# Patient Record
Sex: Male | Born: 1975 | Race: White | Hispanic: No | Smoking: Never smoker
Health system: Southern US, Community
[De-identification: ages and names within clinical notes are randomized; demographics above are authoritative.]

## PROBLEM LIST (undated history)

## (undated) DIAGNOSIS — G43909 Migraine, unspecified, not intractable, without status migrainosus: Secondary | ICD-10-CM

## (undated) DIAGNOSIS — F1911 Other psychoactive substance abuse, in remission: Secondary | ICD-10-CM

## (undated) DIAGNOSIS — M5030 Other cervical disc degeneration, unspecified cervical region: Secondary | ICD-10-CM

## (undated) DIAGNOSIS — F419 Anxiety disorder, unspecified: Secondary | ICD-10-CM

## (undated) DIAGNOSIS — G4730 Sleep apnea, unspecified: Secondary | ICD-10-CM

## (undated) HISTORY — PX: ADENOIDECTOMY: SUR15

## (undated) HISTORY — DX: Anxiety disorder, unspecified: F41.9

## (undated) HISTORY — PX: KNEE SURGERY: SHX244

## (undated) HISTORY — PX: TONSILLECTOMY: SUR1361

## (undated) HISTORY — PX: NECK SURGERY: SHX720

## (undated) HISTORY — DX: Other psychoactive substance abuse, in remission: F19.11

## (undated) HISTORY — DX: Migraine, unspecified, not intractable, without status migrainosus: G43.909

## (undated) HISTORY — DX: Sleep apnea, unspecified: G47.30

## (undated) HISTORY — PX: CYSTOSCOPY W/ STONE MANIPULATION: SHX1427

## (undated) HISTORY — PX: NASAL SEPTUM SURGERY: SHX37

## (undated) HISTORY — PX: URETHRA SURGERY: SHX824

## (undated) HISTORY — DX: Other cervical disc degeneration, unspecified cervical region: M50.30

---

## 2001-07-20 ENCOUNTER — Emergency Department (HOSPITAL_COMMUNITY): Admission: EM | Admit: 2001-07-20 | Discharge: 2001-07-20 | Payer: Self-pay | Admitting: Emergency Medicine

## 2009-01-11 ENCOUNTER — Emergency Department (HOSPITAL_COMMUNITY): Admission: EM | Admit: 2009-01-11 | Discharge: 2009-01-11 | Payer: Self-pay | Admitting: Family Medicine

## 2009-03-01 ENCOUNTER — Emergency Department (HOSPITAL_BASED_OUTPATIENT_CLINIC_OR_DEPARTMENT_OTHER): Admission: EM | Admit: 2009-03-01 | Discharge: 2009-03-01 | Payer: Self-pay | Admitting: Emergency Medicine

## 2009-03-15 ENCOUNTER — Ambulatory Visit: Payer: Self-pay | Admitting: Diagnostic Radiology

## 2009-03-15 ENCOUNTER — Ambulatory Visit (HOSPITAL_BASED_OUTPATIENT_CLINIC_OR_DEPARTMENT_OTHER): Admission: RE | Admit: 2009-03-15 | Discharge: 2009-03-15 | Payer: Self-pay | Admitting: Orthopedic Surgery

## 2009-05-24 ENCOUNTER — Emergency Department (HOSPITAL_BASED_OUTPATIENT_CLINIC_OR_DEPARTMENT_OTHER): Admission: EM | Admit: 2009-05-24 | Discharge: 2009-05-24 | Payer: Self-pay | Admitting: Emergency Medicine

## 2009-05-31 ENCOUNTER — Emergency Department (HOSPITAL_COMMUNITY): Admission: EM | Admit: 2009-05-31 | Discharge: 2009-05-31 | Payer: Self-pay | Admitting: Family Medicine

## 2009-09-28 ENCOUNTER — Ambulatory Visit: Payer: Self-pay | Admitting: Diagnostic Radiology

## 2009-09-28 ENCOUNTER — Emergency Department (HOSPITAL_BASED_OUTPATIENT_CLINIC_OR_DEPARTMENT_OTHER): Admission: EM | Admit: 2009-09-28 | Discharge: 2009-09-28 | Payer: Self-pay | Admitting: Emergency Medicine

## 2010-04-11 ENCOUNTER — Emergency Department (HOSPITAL_BASED_OUTPATIENT_CLINIC_OR_DEPARTMENT_OTHER): Admission: EM | Admit: 2010-04-11 | Discharge: 2010-04-11 | Payer: Self-pay | Admitting: Emergency Medicine

## 2010-09-12 ENCOUNTER — Encounter: Admission: RE | Admit: 2010-09-12 | Discharge: 2010-09-12 | Payer: Self-pay | Admitting: Neurosurgery

## 2010-11-10 ENCOUNTER — Encounter: Payer: Self-pay | Admitting: Neurosurgery

## 2010-12-04 ENCOUNTER — Ambulatory Visit (HOSPITAL_BASED_OUTPATIENT_CLINIC_OR_DEPARTMENT_OTHER): Payer: 59 | Admitting: Physical Medicine & Rehabilitation

## 2010-12-04 ENCOUNTER — Encounter: Payer: 59 | Attending: Physical Medicine & Rehabilitation

## 2010-12-04 DIAGNOSIS — M5380 Other specified dorsopathies, site unspecified: Secondary | ICD-10-CM | POA: Insufficient documentation

## 2010-12-04 DIAGNOSIS — M25519 Pain in unspecified shoulder: Secondary | ICD-10-CM | POA: Insufficient documentation

## 2010-12-04 DIAGNOSIS — M5020 Other cervical disc displacement, unspecified cervical region: Secondary | ICD-10-CM | POA: Insufficient documentation

## 2010-12-04 DIAGNOSIS — M25529 Pain in unspecified elbow: Secondary | ICD-10-CM

## 2010-12-04 DIAGNOSIS — R209 Unspecified disturbances of skin sensation: Secondary | ICD-10-CM

## 2010-12-04 DIAGNOSIS — M542 Cervicalgia: Secondary | ICD-10-CM | POA: Insufficient documentation

## 2011-01-04 ENCOUNTER — Ambulatory Visit: Payer: 59 | Admitting: Physical Medicine & Rehabilitation

## 2011-01-17 NOTE — Consult Note (Signed)
Consult requested by Dr. Jeral Fruit in regard to neck and right shoulder pain.  Seth Booker is a 35 year old male who had onset of right shoulder pain.  He was initially evaluated by Orthopedics, Dr. Madelon Lips, who felt the patient had more of a cervical spine problem and referred the patient to Dr. Hilda Lias.  The patient also underwent MRI of the cervical spine demonstrating C3-4 broad-based disk osteophyte complex and is a C7- T1 left cord disk osteophyte complex, really nothing on the right side. He had EMG and CV right upper extremity which was negative for radiculopathy, ulnar or median neuropathy.  He was trialed on Norco, Robaxin, steroid taper.  He had a CT myelogram of the upper extremity of the neck on September 12, 2010, showed a small left posterolateral protrusion, a small central bulge at C5-6, noncompressive.  He has nighttime pain.  All previous x-rays of shoulder were reported as negative.  His average pain is around 8/10, currently 6.  Pain interferes with activity at 6/10 level.  Pain is worse in the evening hours.  Sleep is poor because of this.  Pain improves with rest medication.  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  He has some numbness in the little finger on the right side only.  SOCIAL HISTORY:  Married.  He had a DUI in 2005.  He uses smokeless tobacco.  He lives with his wife and daughter.  FAMILY HISTORY:  Diabetes in sister.  PHYSICAL EXAMINATION:  Blood pressure 149/97, pulse 112, respirations 18, sat 98% on room air.  He has positive impingement sign in right shoulder.  He has no evidence of scapular muscle atrophy.  He has no evidence of pain over the [facility] joint.  He has mild pain over the bicipital groove.  No snapping with external rotation at the bicipital groove.  He has no evidence of abnormality in his right biceps muscle.  He has negative Tinel at the right elbow.  Sensation is normal in the bilateral upper  extremities.  He has normal strength in the upper extremities. Neck has negative Spurling maneuver.  Neck has full range of motion.  IMPRESSION: 1. Right shoulder pain.  I really do not think this has anything to do     with his neck.  I suspect that he may have some cuff tendonitis and     he does have positive impingement sign.  He may have a biceps     tenosynovitis synovitis.  We will check with an ultrasound and see     if we can further delineate this. 2. Right fifth digit numbness.  This appears to be more intermittent     which may explain why his EMG is negative.  He may have a subluxing     ulnar nerve root, do dynamic imaging under ultrasound and see if     this is indeed the case.  Otherwise, he may need to keep his elbows     in an extended position and keep the weight off the elbow.  He gets relief with Neurontin 300 t.i.d.  He can continue this. Flexeril, he does get some relief with this.  Norco, he takes about three times a day.  We talked that our goal will be to wean off that at some point.  He may need to go through some physical therapy, however, to deal with his shoulder pain. Discussed with the patient, agrees with plan.     Erick Colace,  M.D.    AEK/MedQ D:12/04/2010 17:00:16  T:12/04/2010 22:53:56  Job #:  [id number]  cc:   Hilda Lias, M.D. Fax: [phone]  Electronically Signed by Claudette Laws M.D. on 01/17/2011 04:37:49 PM

## 2011-02-11 ENCOUNTER — Ambulatory Visit: Payer: 59 | Admitting: Physical Medicine & Rehabilitation

## 2011-05-23 ENCOUNTER — Emergency Department (INDEPENDENT_AMBULATORY_CARE_PROVIDER_SITE_OTHER): Payer: 59

## 2011-05-23 ENCOUNTER — Emergency Department (HOSPITAL_BASED_OUTPATIENT_CLINIC_OR_DEPARTMENT_OTHER)
Admission: EM | Admit: 2011-05-23 | Discharge: 2011-05-23 | Disposition: A | Discharge status: Home / Self Care | Payer: 59 | Attending: Emergency Medicine | Admitting: Emergency Medicine

## 2011-05-23 ENCOUNTER — Encounter: Payer: Self-pay | Admitting: *Deleted

## 2011-05-23 DIAGNOSIS — M7540 Impingement syndrome of unspecified shoulder: Secondary | ICD-10-CM

## 2011-05-23 DIAGNOSIS — M25519 Pain in unspecified shoulder: Secondary | ICD-10-CM | POA: Insufficient documentation

## 2011-05-23 DIAGNOSIS — R209 Unspecified disturbances of skin sensation: Secondary | ICD-10-CM

## 2011-05-23 MED ORDER — CYCLOBENZAPRINE HCL 10 MG PO TABS: 10.0000 mg | ORAL_TABLET | Freq: Two times a day (BID) | ORAL | Status: AC | PRN

## 2011-05-23 MED ORDER — OXYCODONE-ACETAMINOPHEN 5-325 MG PO TABS
ORAL_TABLET | ORAL | Status: AC
  Administered 2011-05-23: 1 via ORAL
  Filled 2011-05-23: qty 1

## 2011-05-23 MED ORDER — HYDROCODONE-ACETAMINOPHEN 5-325 MG PO TABS: 2.0000 | ORAL_TABLET | ORAL | Status: AC | PRN

## 2011-05-23 MED ORDER — CYCLOBENZAPRINE HCL 10 MG PO TABS: 10.0000 mg | ORAL_TABLET | Freq: Two times a day (BID) | ORAL | Status: DC | PRN

## 2011-05-23 MED ORDER — OXYCODONE-ACETAMINOPHEN 5-325 MG PO TABS
1.0000 | ORAL_TABLET | Freq: Once | ORAL | Status: AC
  Administered 2011-05-23: 1 via ORAL

## 2011-05-23 NOTE — ED Provider Notes (Signed)
History     CSN: [id number] Arrival date & time: 05/23/2011  8:41 AM  Chief Complaint  Patient presents with  . Shoulder Pain   HPI Comments: Pt reports left shoulder pain since Sunday. Denies injury. OTC pain medications ineffective. Patient states she's had problems with his right shoulder in the past. He saw Dr. Dorena Bodo and end up getting and referred for epidural injections. Patient states his symptoms resolved after that however this episode feels somewhat similar although when his left shoulder.  Patient is a 35 y.o. male presenting with shoulder pain. The history is provided by the patient.  Shoulder Pain The problem occurs constantly. The problem has not changed since onset.Pertinent negatives include no chest pain, no abdominal pain, no headaches and no shortness of breath. Exacerbated by: The pain increases with certain movements of his arm. Especially by him reaching back and over his head. Relieved by: Patient's tried antacids without relief. The treatment provided no relief.    History reviewed. No pertinent past medical history.  Past Surgical History  Procedure Date  . Tonsillectomy   . Adenoidectomy   . Knee surgery     No family history on file.  History  Substance Use Topics  . Smoking status: Never Smoker   . Smokeless tobacco: Not on file  . Alcohol Use: No      Review of Systems  Constitutional: Negative for fever.  Respiratory: Negative for chest tightness and shortness of breath.   Cardiovascular: Negative for chest pain.  Gastrointestinal: Negative for abdominal pain.  Neurological: Negative for headaches.  All other systems reviewed and are negative.    Physical Exam  BP 149/96  Pulse 91  Temp(Src) 98.2 F (36.8 C) (Oral)  Resp 16  Ht 6\' 2"  (1.88 m)  Wt 250 lb (113.399 kg)  BMI 32.10 kg/m2  SpO2 100%  Physical Exam  Constitutional: He appears well-developed and well-nourished. No distress.  HENT:  Head: Normocephalic and atraumatic.    Right Ear: External ear normal.  Left Ear: External ear normal.  Eyes: Conjunctivae are normal. Right eye exhibits no discharge. Left eye exhibits no discharge. No scleral icterus.  Neck: Neck supple. No tracheal deviation present.  Cardiovascular: Normal rate, regular rhythm and normal heart sounds.   Pulmonary/Chest: Effort normal and breath sounds normal. No stridor. No respiratory distress. He has no wheezes.  Musculoskeletal: He exhibits tenderness. He exhibits no edema.       Mild tenderness in the left periscapular region. Pain in left shoulder with abduction and external rotation of the arm. Strong radial pulse. Distal sensation intact.  Neurological: He is alert. Cranial nerve deficit: no gross deficits.  Skin: Skin is warm and dry. No rash noted.  Psychiatric: He has a normal mood and affect.    ED Course  Procedures Shoulder x-ray no acute findings per radiologist MDM Symptoms may be related to rotator cuff syndrome. Could be related to radicular etiology as well however he is not having any neck pain not having any numbness or weakness the pain does not particularly seem to radiate. Therefore favor the former. No other symptoms to suggest some sort of cardiac or pulmonary etiology that is referred to the shoulder. Is not having any GI symptoms to suggest a abdominal etiology. We'll DC home with oral medications. Have him followup with his orthopedic doctor routinely.        Celene Kras, MD 05/23/11 [phone]

## 2011-05-23 NOTE — ED Notes (Signed)
Pt reports left shoulder pain since Sunday. Denies injury. OTC pain medications ineffective.

## 2012-08-26 ENCOUNTER — Other Ambulatory Visit: Payer: Self-pay | Admitting: Neurosurgery

## 2012-08-26 DIAGNOSIS — M542 Cervicalgia: Secondary | ICD-10-CM

## 2012-08-26 DIAGNOSIS — M5410 Radiculopathy, site unspecified: Secondary | ICD-10-CM

## 2012-08-26 DIAGNOSIS — M549 Dorsalgia, unspecified: Secondary | ICD-10-CM

## 2012-09-04 ENCOUNTER — Ambulatory Visit
Admission: RE | Admit: 2012-09-04 | Discharge: 2012-09-04 | Disposition: A | Discharge status: Home / Self Care | Payer: 59 | Source: Ambulatory Visit | Attending: Neurosurgery | Admitting: Neurosurgery

## 2012-09-04 VITALS — BP 140/76 | HR 116 | Ht 74.0 in | Wt 258.0 lb

## 2012-09-04 DIAGNOSIS — M549 Dorsalgia, unspecified: Secondary | ICD-10-CM

## 2012-09-04 DIAGNOSIS — M542 Cervicalgia: Secondary | ICD-10-CM

## 2012-09-04 DIAGNOSIS — M5410 Radiculopathy, site unspecified: Secondary | ICD-10-CM

## 2012-09-04 MED ORDER — IOHEXOL 300 MG/ML  SOLN: 10.0000 mL | Freq: Once | INTRAMUSCULAR | Status: AC | PRN

## 2012-09-04 MED ORDER — MEPERIDINE HCL 100 MG/ML IJ SOLN
100.0000 mg | Freq: Once | INTRAMUSCULAR | Status: AC
  Administered 2012-09-04: 100 mg via INTRAMUSCULAR

## 2012-09-04 MED ORDER — DIAZEPAM 5 MG PO TABS
10.0000 mg | ORAL_TABLET | Freq: Once | ORAL | Status: AC
  Administered 2012-09-04: 10 mg via ORAL

## 2012-09-04 MED ORDER — ONDANSETRON HCL 4 MG/2ML IJ SOLN
4.0000 mg | Freq: Once | INTRAMUSCULAR | Status: AC
  Administered 2012-09-04: 4 mg via INTRAMUSCULAR

## 2013-03-27 DIAGNOSIS — F17200 Nicotine dependence, unspecified, uncomplicated: Secondary | ICD-10-CM | POA: Insufficient documentation

## 2013-03-27 DIAGNOSIS — F39 Unspecified mood [affective] disorder: Secondary | ICD-10-CM | POA: Insufficient documentation

## 2013-03-27 DIAGNOSIS — F411 Generalized anxiety disorder: Secondary | ICD-10-CM | POA: Insufficient documentation

## 2013-03-27 DIAGNOSIS — M5030 Other cervical disc degeneration, unspecified cervical region: Secondary | ICD-10-CM | POA: Insufficient documentation

## 2013-08-14 DIAGNOSIS — G43909 Migraine, unspecified, not intractable, without status migrainosus: Secondary | ICD-10-CM | POA: Insufficient documentation

## 2013-12-27 DIAGNOSIS — G4733 Obstructive sleep apnea (adult) (pediatric): Secondary | ICD-10-CM | POA: Insufficient documentation

## 2014-07-05 ENCOUNTER — Other Ambulatory Visit: Payer: Self-pay | Admitting: Neurosurgery

## 2014-07-05 DIAGNOSIS — M5412 Radiculopathy, cervical region: Secondary | ICD-10-CM

## 2014-07-22 ENCOUNTER — Ambulatory Visit
Admission: RE | Admit: 2014-07-22 | Discharge: 2014-07-22 | Disposition: A | Discharge status: Home / Self Care | Payer: 59 | Source: Ambulatory Visit | Attending: Neurosurgery | Admitting: Neurosurgery

## 2014-07-22 VITALS — BP 123/76 | HR 98

## 2014-07-22 DIAGNOSIS — M5412 Radiculopathy, cervical region: Secondary | ICD-10-CM

## 2014-07-22 DIAGNOSIS — M5030 Other cervical disc degeneration, unspecified cervical region: Secondary | ICD-10-CM

## 2014-07-22 MED ORDER — HYDROMORPHONE HCL 2 MG/ML IJ SOLN
2.0000 mg | Freq: Once | INTRAMUSCULAR | Status: AC
  Administered 2014-07-22: 2 mg via INTRAMUSCULAR

## 2014-07-22 MED ORDER — DIAZEPAM 5 MG PO TABS
10.0000 mg | ORAL_TABLET | Freq: Once | ORAL | Status: AC
  Administered 2014-07-22: 10 mg via ORAL

## 2014-07-22 MED ORDER — ONDANSETRON HCL 4 MG/2ML IJ SOLN
4.0000 mg | Freq: Once | INTRAMUSCULAR | Status: AC
  Administered 2014-07-22: 4 mg via INTRAMUSCULAR

## 2014-07-22 MED ORDER — IOHEXOL 300 MG/ML  SOLN
10.0000 mL | Freq: Once | INTRAMUSCULAR | Status: AC | PRN
  Administered 2014-07-22: 10 mL via INTRATHECAL

## 2014-07-22 NOTE — Discharge Instructions (Signed)

## 2017-07-18 ENCOUNTER — Encounter (HOSPITAL_BASED_OUTPATIENT_CLINIC_OR_DEPARTMENT_OTHER): Payer: Self-pay | Admitting: Emergency Medicine

## 2017-07-18 ENCOUNTER — Emergency Department (HOSPITAL_BASED_OUTPATIENT_CLINIC_OR_DEPARTMENT_OTHER)
Admission: EM | Admit: 2017-07-18 | Discharge: 2017-07-18 | Disposition: A | Discharge status: Home / Self Care | Payer: 59 | Attending: Emergency Medicine | Admitting: Emergency Medicine

## 2017-07-18 DIAGNOSIS — Z5321 Procedure and treatment not carried out due to patient leaving prior to being seen by health care provider: Secondary | ICD-10-CM | POA: Diagnosis not present

## 2017-07-18 DIAGNOSIS — M542 Cervicalgia: Secondary | ICD-10-CM | POA: Insufficient documentation

## 2017-07-18 DIAGNOSIS — M549 Dorsalgia, unspecified: Secondary | ICD-10-CM | POA: Insufficient documentation

## 2017-07-18 NOTE — ED Notes (Addendum)
Pt seen by EMT leaving the [facility].  Pt did not speak to staff.  Walking with brisk gait. Pt had not been seen by edp.
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
## 2017-07-25 ENCOUNTER — Encounter (HOSPITAL_COMMUNITY): Payer: Self-pay | Admitting: Emergency Medicine

## 2017-07-25 ENCOUNTER — Emergency Department (HOSPITAL_COMMUNITY): Payer: 59

## 2017-07-25 ENCOUNTER — Emergency Department (HOSPITAL_COMMUNITY)
Admission: EM | Admit: 2017-07-25 | Discharge: 2017-07-25 | Disposition: A | Discharge status: Home / Self Care | Payer: 59 | Attending: Emergency Medicine | Admitting: Emergency Medicine

## 2017-07-25 DIAGNOSIS — Y999 Unspecified external cause status: Secondary | ICD-10-CM | POA: Diagnosis not present

## 2017-07-25 DIAGNOSIS — S022XXA Fracture of nasal bones, initial encounter for closed fracture: Secondary | ICD-10-CM | POA: Diagnosis not present

## 2017-07-25 DIAGNOSIS — Z23 Encounter for immunization: Secondary | ICD-10-CM | POA: Diagnosis not present

## 2017-07-25 DIAGNOSIS — S060X0A Concussion without loss of consciousness, initial encounter: Secondary | ICD-10-CM | POA: Diagnosis not present

## 2017-07-25 DIAGNOSIS — Z79899 Other long term (current) drug therapy: Secondary | ICD-10-CM | POA: Insufficient documentation

## 2017-07-25 DIAGNOSIS — Y9389 Activity, other specified: Secondary | ICD-10-CM | POA: Insufficient documentation

## 2017-07-25 DIAGNOSIS — S0081XA Abrasion of other part of head, initial encounter: Secondary | ICD-10-CM | POA: Insufficient documentation

## 2017-07-25 DIAGNOSIS — S60512A Abrasion of left hand, initial encounter: Secondary | ICD-10-CM | POA: Diagnosis not present

## 2017-07-25 DIAGNOSIS — Y92411 Interstate highway as the place of occurrence of the external cause: Secondary | ICD-10-CM | POA: Insufficient documentation

## 2017-07-25 DIAGNOSIS — S60511A Abrasion of right hand, initial encounter: Secondary | ICD-10-CM | POA: Diagnosis not present

## 2017-07-25 DIAGNOSIS — S060X1A Concussion with loss of consciousness of 30 minutes or less, initial encounter: Secondary | ICD-10-CM

## 2017-07-25 DIAGNOSIS — S3991XA Unspecified injury of abdomen, initial encounter: Secondary | ICD-10-CM | POA: Diagnosis not present

## 2017-07-25 DIAGNOSIS — S0990XA Unspecified injury of head, initial encounter: Secondary | ICD-10-CM | POA: Diagnosis present

## 2017-07-25 DIAGNOSIS — T07XXXA Unspecified multiple injuries, initial encounter: Secondary | ICD-10-CM

## 2017-07-25 DIAGNOSIS — F17220 Nicotine dependence, chewing tobacco, uncomplicated: Secondary | ICD-10-CM | POA: Insufficient documentation

## 2017-07-25 LAB — BASIC METABOLIC PANEL
ANION GAP: 8 (ref 5–15)
BUN: 10 mg/dL (ref 6–20)
CALCIUM: 8.8 mg/dL — AB (ref 8.9–10.3)
CO2: 23 mmol/L (ref 22–32)
CREATININE: 0.89 mg/dL (ref 0.61–1.24)
Chloride: 108 mmol/L (ref 101–111)
GFR calc Af Amer: >60 mL/min (ref >60)
GLUCOSE: 122 mg/dL — AB (ref 65–99)
Potassium: 4 mmol/L (ref 3.5–5.1)
Sodium: 139 mmol/L (ref 135–145)

## 2017-07-25 LAB — CBC
HCT: 33.9 % — ABNORMAL LOW (ref 39.0–52.0)
Hemoglobin: 12 g/dL — ABNORMAL LOW (ref 13.0–17.0)
MCH: 29.6 pg (ref 26.0–34.0)
MCHC: 35.4 g/dL (ref 30.0–36.0)
MCV: 83.7 fL (ref 78.0–100.0)
PLATELETS: 303 10*3/uL (ref 150–400)
RBC: 4.05 MIL/uL — ABNORMAL LOW (ref 4.22–5.81)
RDW: 13.7 % (ref 11.5–15.5)
WBC: 10.6 10*3/uL — AB (ref 4.0–10.5)

## 2017-07-25 LAB — TYPE AND SCREEN
ABO/RH(D): O POS
Antibody Screen: NEGATIVE

## 2017-07-25 LAB — ABO/RH: ABO/RH(D): O POS

## 2017-07-25 MED ORDER — LIDOCAINE HCL (PF) 1 % IJ SOLN
INTRAMUSCULAR | Status: AC
  Filled 2017-07-25: qty 5

## 2017-07-25 MED ORDER — TETANUS-DIPHTH-ACELL PERTUSSIS 5-2.5-18.5 LF-MCG/0.5 IM SUSP
0.5000 mL | Freq: Once | INTRAMUSCULAR | Status: AC
  Administered 2017-07-25: 0.5 mL via INTRAMUSCULAR
  Filled 2017-07-25: qty 0.5

## 2017-07-25 MED ORDER — CYCLOBENZAPRINE HCL 10 MG PO TABS: 10.0000 mg | ORAL_TABLET | Freq: Three times a day (TID) | ORAL | 0 refills | Status: DC | PRN

## 2017-07-25 MED ORDER — IOPAMIDOL (ISOVUE-300) INJECTION 61%
INTRAVENOUS | Status: AC
  Administered 2017-07-25: 100 mL
  Filled 2017-07-25: qty 100

## 2017-07-25 MED ORDER — HYDROMORPHONE HCL 1 MG/ML IJ SOLN
1.0000 mg | Freq: Once | INTRAMUSCULAR | Status: AC
  Administered 2017-07-25: 1 mg via INTRAVENOUS
  Filled 2017-07-25: qty 1

## 2017-07-25 MED ORDER — SODIUM CHLORIDE 0.9 % IV BOLUS (SEPSIS)
1000.0000 mL | Freq: Once | INTRAVENOUS | Status: AC
  Administered 2017-07-25: 1000 mL via INTRAVENOUS

## 2017-07-25 MED ORDER — HYDROCODONE-ACETAMINOPHEN 5-325 MG PO TABS: 1.0000 | ORAL_TABLET | ORAL | 0 refills | Status: DC | PRN

## 2017-07-25 NOTE — ED Notes (Signed)
Pt ambulatory in hall independently with a steady gait. EDP at bedside
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
## 2017-07-25 NOTE — ED Notes (Signed)
Patient left at this time with all belongings. 

## 2017-07-25 NOTE — ED Notes (Signed)
MD at bedside. 
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
## 2020-01-19 ENCOUNTER — Other Ambulatory Visit: Payer: Self-pay | Admitting: Orthopedic Surgery

## 2020-01-19 DIAGNOSIS — M25511 Pain in right shoulder: Secondary | ICD-10-CM

## 2020-02-18 ENCOUNTER — Ambulatory Visit
Admission: RE | Admit: 2020-02-18 | Discharge: 2020-02-18 | Disposition: A | Discharge status: Home / Self Care | Payer: 59 | Source: Ambulatory Visit | Attending: Orthopedic Surgery | Admitting: Orthopedic Surgery

## 2020-02-18 DIAGNOSIS — M25511 Pain in right shoulder: Secondary | ICD-10-CM

## 2021-07-26 ENCOUNTER — Other Ambulatory Visit: Payer: Self-pay

## 2021-07-26 ENCOUNTER — Encounter (HOSPITAL_COMMUNITY): Payer: Self-pay | Admitting: Emergency Medicine

## 2021-07-26 ENCOUNTER — Ambulatory Visit (HOSPITAL_COMMUNITY)
Admission: EM | Admit: 2021-07-26 | Discharge: 2021-07-26 | Disposition: A | Discharge status: Home / Self Care | Payer: 59 | Attending: Emergency Medicine | Admitting: Emergency Medicine

## 2021-07-26 DIAGNOSIS — H66013 Acute suppurative otitis media with spontaneous rupture of ear drum, bilateral: Secondary | ICD-10-CM | POA: Diagnosis not present

## 2021-07-26 MED ORDER — AMOXICILLIN 500 MG PO CAPS: 500.0000 mg | ORAL_CAPSULE | Freq: Two times a day (BID) | ORAL | 0 refills | Status: AC

## 2021-07-26 MED ORDER — OFLOXACIN 0.3 % OT SOLN: 5.0000 [drp] | Freq: Every day | OTIC | 0 refills | Status: DC

## 2021-07-26 NOTE — ED Provider Notes (Signed)
[facility]    CSN: [id number] Arrival date & time: 07/26/21  0815      History   Chief Complaint Chief Complaint  Patient presents with   Otalgia   Cough   Nasal Congestion    HPI Seth Booker is a 45 y.o. male.   Patient here for evaluation of bilateral ear pain, worse in left.  Reports noticing blood coming from his left ear this morning.  Reports having some sinus pressure and congestion for the past week.  Has not taken any OTC medications or treatments.  Denies any trauma, injury, or other precipitating event.  Denies any specific alleviating or aggravating factors.  Denies any fevers, chest pain, shortness of breath, N/V/D, numbness, tingling, weakness, abdominal pain, or headaches.    The history is provided by the patient.  Otalgia Associated symptoms: congestion, cough and ear discharge   Cough Associated symptoms: ear pain    History reviewed. No pertinent past medical history.  Patient Active Problem List   Diagnosis Date Noted   Obstructive apnea 12/27/2013   Headache, migraine 08/14/2013   DDD (degenerative disc disease), cervical 03/27/2013   Affective disorder (HCC) 03/27/2013   Anxiety, generalized 03/27/2013   Compulsive tobacco user syndrome 03/27/2013    Past Surgical History:  Procedure Laterality Date   ADENOIDECTOMY     KNEE SURGERY     NECK SURGERY     TONSILLECTOMY         Home Medications    Prior to Admission medications   Medication Sig Start Date End Date Taking? Authorizing Provider  amoxicillin (AMOXIL) 500 MG capsule Take 1 capsule (500 mg total) by mouth 2 (two) times daily for 7 days. 07/26/21 08/02/21 Yes Ivette Loyal, NP  ofloxacin (FLOXIN) 0.3 % OTIC solution Place 5 drops into both ears daily. 07/26/21  Yes Ivette Loyal, NP  cyclobenzaprine (FLEXERIL) 10 MG tablet Take 1 tablet (10 mg total) by mouth 3 (three) times daily as needed for muscle spasms. 07/25/17   Azalia Bilis, MD  gabapentin (NEURONTIN) 300  MG capsule Take 300 mg by mouth 3 (three) times daily.  10/08/13   [provider]  HYDROcodone-acetaminophen (NORCO/VICODIN) 5-325 MG tablet Take 1 tablet by mouth every 4 (four) hours as needed for moderate pain. 07/25/17   Azalia Bilis, MD  tiZANidine (ZANAFLEX) 4 MG capsule Take 4 mg by mouth 3 (three) times daily as needed for muscle spasms.  03/15/14   [provider]    Family History History reviewed. No pertinent family history.  Social History Social History   Tobacco Use   Smoking status: Never   Smokeless tobacco: Current    Types: Chew, Snuff  Substance Use Topics   Alcohol use: Yes   Drug use: No     Allergies   Bee venom   Review of Systems Review of Systems  HENT:  Positive for congestion, ear discharge and ear pain.   Respiratory:  Positive for cough.   All other systems reviewed and are negative.   Physical Exam Triage Vital Signs ED Triage Vitals  Enc Vitals Group     BP 07/26/21 0848 (!) 164/95     Pulse Rate 07/26/21 0848 (!) 104     Resp 07/26/21 0848 18     Temp 07/26/21 0848 98.2 F (36.8 C)     Temp src --      SpO2 07/26/21 0848 96 %     Weight --  Height --      Head Circumference --      Peak Flow --      Pain Score 07/26/21 0846 7     Pain Loc --      Pain Edu? --      Excl. in GC? --    No data found.  Updated Vital Signs BP (!) 164/95   Pulse (!) 104   Temp 98.2 F (36.8 C)   Resp 18   SpO2 96%   Visual Acuity Right Eye Distance:   Left Eye Distance:   Bilateral Distance:    Right Eye Near:   Left Eye Near:    Bilateral Near:     Physical Exam Vitals and nursing note reviewed.  Constitutional:      General: He is not in acute distress.    Appearance: Normal appearance. He is not ill-appearing, toxic-appearing or diaphoretic.  HENT:     Head: Normocephalic and atraumatic.     Right Ear: There is hemotympanum.     Left Ear: Drainage present. There is hemotympanum. Tympanic membrane is  perforated.  Eyes:     Conjunctiva/sclera: Conjunctivae normal.  Cardiovascular:     Rate and Rhythm: Normal rate.     Pulses: Normal pulses.  Pulmonary:     Effort: Pulmonary effort is normal.  Abdominal:     General: Abdomen is flat.  Musculoskeletal:        General: Normal range of motion.     Cervical back: Normal range of motion.  Skin:    General: Skin is warm and dry.  Neurological:     General: No focal deficit present.     Mental Status: He is alert and oriented to person, place, and time.  Psychiatric:        Mood and Affect: Mood normal.     UC Treatments / Results  Labs (all labs ordered are listed, but only abnormal results are displayed) Labs Reviewed - No data to display  EKG   Radiology No results found.  Procedures Procedures (including critical care time)  Medications Ordered in UC Medications - No data to display  Initial Impression / Assessment and Plan / UC Course  I have reviewed the triage vital signs and the nursing notes.  Pertinent labs & imaging results that were available during my care of the patient were reviewed by me and considered in my medical decision making (see chart for details).    Acute otitis media with spontaneous rupture of the tympanic membrane.  Ofloxacin eardrops and amoxicillin twice daily.  Patient instructed not stick anything into your canal and to keep ear canals dry.  Follow-up with ENT as soon as possible.  Appointment made with [facility] for later this afternoon. Final Clinical Impressions(s) / UC Diagnoses   Final diagnoses:  Acute suppurative otitis media of both ears with spontaneous rupture of tympanic membranes, recurrence not specified     Discharge Instructions      Use the Ofloxacin 5 drops in each ear daily for the next 5 days.  Take the amoxcillin 1 pill twice a day for the next 7 days.   Do not stick anything into your ear canal.  Do not let water get into your ear canal.  You can soak a  cotton ball with petroleum jelly and place it in your ear to keep it dry when showering.   Follow up with ENT as soon as possible.  [name] will be contacting you for  an appointment.      ED Prescriptions     Medication Sig Dispense Auth. Provider   ofloxacin (FLOXIN) 0.3 % OTIC solution Place 5 drops into both ears daily. 5 mL Ivette Loyal, NP   amoxicillin (AMOXIL) 500 MG capsule Take 1 capsule (500 mg total) by mouth 2 (two) times daily for 7 days. 14 capsule Ivette Loyal, NP      PDMP not reviewed this encounter.   Ivette Loyal, NP 07/26/21 [phone]

## 2021-07-26 NOTE — ED Triage Notes (Signed)
Pt states that he is also had nasal congestion and coughing going on for the last week. Pt states that he also noticed blood coming from his lefty ear last night

## 2021-07-26 NOTE — Discharge Instructions (Signed)
Use the Ofloxacin 5 drops in each ear daily for the next 5 days.  Take the amoxcillin 1 pill twice a day for the next 7 days.   Do not stick anything into your ear canal.  Do not let water get into your ear canal.  You can soak a cotton ball with petroleum jelly and place it in your ear to keep it dry when showering.   Follow up with ENT as soon as possible.  [facility] will be contacting you for an appointment.

## 2021-09-20 ENCOUNTER — Encounter (HOSPITAL_COMMUNITY): Payer: Self-pay | Admitting: Emergency Medicine

## 2021-09-20 ENCOUNTER — Ambulatory Visit (HOSPITAL_COMMUNITY)
Admission: EM | Admit: 2021-09-20 | Discharge: 2021-09-20 | Disposition: A | Discharge status: Home / Self Care | Payer: 59 | Attending: Emergency Medicine | Admitting: Emergency Medicine

## 2021-09-20 DIAGNOSIS — R519 Headache, unspecified: Secondary | ICD-10-CM | POA: Diagnosis not present

## 2021-09-20 MED ORDER — METOCLOPRAMIDE HCL 5 MG/ML IJ SOLN
10.0000 mg | Freq: Once | INTRAMUSCULAR | Status: AC
Start: 2021-09-20 — End: 2021-09-20
  Administered 2021-09-20: 10 mg via INTRAMUSCULAR

## 2021-09-20 MED ORDER — METOCLOPRAMIDE HCL 5 MG/ML IJ SOLN
INTRAMUSCULAR | Status: AC
  Filled 2021-09-20: qty 2

## 2021-09-20 MED ORDER — KETOROLAC TROMETHAMINE 30 MG/ML IJ SOLN
30.0000 mg | Freq: Once | INTRAMUSCULAR | Status: AC
  Administered 2021-09-20: 30 mg via INTRAMUSCULAR

## 2021-09-20 MED ORDER — SUMATRIPTAN 20 MG/ACT NA SOLN: 20.0000 mg | NASAL | 0 refills | Status: DC | PRN

## 2021-09-20 MED ORDER — SUMATRIPTAN SUCCINATE 6 MG/0.5ML ~~LOC~~ SOLN
6.0000 mg | Freq: Once | SUBCUTANEOUS | Status: AC
  Administered 2021-09-20: 6 mg via SUBCUTANEOUS

## 2021-09-20 MED ORDER — KETOROLAC TROMETHAMINE 30 MG/ML IJ SOLN
INTRAMUSCULAR | Status: AC
  Filled 2021-09-20: qty 1

## 2021-09-20 MED ORDER — DEXAMETHASONE SODIUM PHOSPHATE 10 MG/ML IJ SOLN
10.0000 mg | Freq: Once | INTRAMUSCULAR | Status: AC
  Administered 2021-09-20: 10 mg via INTRAMUSCULAR

## 2021-09-20 MED ORDER — SUMATRIPTAN SUCCINATE 6 MG/0.5ML ~~LOC~~ SOLN
SUBCUTANEOUS | Status: AC
  Filled 2021-09-20: qty 0.5

## 2021-09-20 MED ORDER — DEXAMETHASONE SODIUM PHOSPHATE 10 MG/ML IJ SOLN
INTRAMUSCULAR | Status: AC
  Filled 2021-09-20: qty 1

## 2021-09-20 NOTE — ED Triage Notes (Signed)
Pt reports had anterior migraine since 530am today with nausea. Reports used to have migraines but been long time so has no medications. Tried aleve, Goody powder and other OTC meds without relief.

## 2021-09-20 NOTE — Discharge Instructions (Signed)
Attempted use of over-the-counter ibuprofen initially for headaches, if ineffective may attempt use of Imitrex  Spray into nare once then wait 2 hours if headache is still present may use additional dose, do not use more than twice within a 24-hour period  If headaches become more frequent and persistent please follow-up with your primary care or neurologist for further evaluation  If headache becomes severe in seems to be the worst headache you have ever had please go to the nearest emergency department for further evaluation

## 2021-09-20 NOTE — ED Provider Notes (Signed)
[facility]    CSN: [id number] Arrival date & time: 09/20/21  1207      History   Chief Complaint Chief Complaint  Patient presents with   Migraine    HPI Seth Booker is a 45 y.o. male.   Patient presents with frontal headache beginning today, phonophobia and nausea associated. Attempted use of goody's powder, aleve, tylenol, ibuprofen with no relief. Last does around 9 AM. History of migraines, anxiety, sleep apnea.  Patient endorses that he has been working more than normal and has been doing so since July 2022.  Endorses this may be contributing to symptoms as he has not been sleeping well. denies blurred vision, floaters photosensitivity, vomiting, dizziness, lightheadedness, weakness, slurred speech.    History reviewed. No pertinent past medical history.  Patient Active Problem List   Diagnosis Date Noted   Obstructive apnea 12/27/2013   Headache, migraine 08/14/2013   DDD (degenerative disc disease), cervical 03/27/2013   Affective disorder (HCC) 03/27/2013   Anxiety, generalized 03/27/2013   Compulsive tobacco user syndrome 03/27/2013    Past Surgical History:  Procedure Laterality Date   ADENOIDECTOMY     KNEE SURGERY     NECK SURGERY     TONSILLECTOMY         Home Medications    Prior to Admission medications   Medication Sig Start Date End Date Taking? Authorizing Provider  cyclobenzaprine (FLEXERIL) 10 MG tablet Take 1 tablet (10 mg total) by mouth 3 (three) times daily as needed for muscle spasms. 07/25/17   Azalia Bilis, MD  gabapentin (NEURONTIN) 300 MG capsule Take 300 mg by mouth 3 (three) times daily.  10/08/13   [provider]  HYDROcodone-acetaminophen (NORCO/VICODIN) 5-325 MG tablet Take 1 tablet by mouth every 4 (four) hours as needed for moderate pain. 07/25/17   Azalia Bilis, MD  ofloxacin (FLOXIN) 0.3 % OTIC solution Place 5 drops into both ears daily. 07/26/21   Ivette Loyal, NP  tiZANidine (ZANAFLEX) 4 MG capsule  Take 4 mg by mouth 3 (three) times daily as needed for muscle spasms.  03/15/14   [provider]    Family History No family history on file.  Social History Social History   Tobacco Use   Smoking status: Never   Smokeless tobacco: Current    Types: Chew, Snuff  Substance Use Topics   Alcohol use: Yes   Drug use: No     Allergies   Bee venom   Review of Systems Review of Systems  Constitutional: Negative.   Respiratory: Negative.    Cardiovascular: Negative.   Neurological:  Positive for headaches. Negative for dizziness, tremors, seizures, syncope, facial asymmetry, speech difficulty, weakness, light-headedness and numbness.    Physical Exam Triage Vital Signs ED Triage Vitals  Enc Vitals Group     BP 09/20/21 1318 (!) 152/81     Pulse Rate 09/20/21 1318 82     Resp 09/20/21 1318 17     Temp 09/20/21 1318 97.9 F (36.6 C)     Temp Source 09/20/21 1318 Oral     SpO2 09/20/21 1318 97 %     Weight --      Height --      Head Circumference --      Peak Flow --      Pain Score 09/20/21 1316 7     Pain Loc --      Pain Edu? --      Excl. in GC? --  No data found.  Updated Vital Signs BP (!) 152/81 (BP Location: Right Arm)   Pulse 82   Temp 97.9 F (36.6 C) (Oral)   Resp 17   SpO2 97%   Visual Acuity Right Eye Distance:   Left Eye Distance:   Bilateral Distance:    Right Eye Near:   Left Eye Near:    Bilateral Near:     Physical Exam Constitutional:      Appearance: Normal appearance. He is normal weight.  HENT:     Head: Normocephalic.  Eyes:     Extraocular Movements: Extraocular movements intact.  Cardiovascular:     Rate and Rhythm: Normal rate and regular rhythm.     Pulses: Normal pulses.     Heart sounds: Normal heart sounds.  Pulmonary:     Effort: Pulmonary effort is normal.  Skin:    General: Skin is warm and dry.  Neurological:     General: No focal deficit present.     Mental Status: He is alert and oriented to  person, place, and time. Mental status is at baseline.  Psychiatric:        Mood and Affect: Mood normal.        Behavior: Behavior normal.     UC Treatments / Results  Labs (all labs ordered are listed, but only abnormal results are displayed) Labs Reviewed - No data to display  EKG   Radiology No results found.  Procedures Procedures (including critical care time)  Medications Ordered in UC Medications - No data to display  Initial Impression / Assessment and Plan / UC Course  I have reviewed the triage vital signs and the nursing notes.  Pertinent labs & imaging results that were available during my care of the patient were reviewed by me and considered in my medical decision making (see chart for details).  Bad headache  1.  Toradol 30 mg IM, Decadron 10 mg IM now, Imitrex 6 mg subcu now, Reglan 10 mg IM now 2.  Imitrex 20 mg nasal spray as directed, patient has used medication in the past with success 3.  For persistent or reoccurring headaches advised patient to follow-up with primary care doctor or neurologist for worsening headache patient advised to go to nearest emergency department for further evaluation 4.  Work note given Final Clinical Impressions(s) / UC Diagnoses   Final diagnoses:  None   Discharge Instructions   None    ED Prescriptions   None    PDMP not reviewed this encounter.   Valinda Hoar, NP 09/20/21 1413

## 2022-01-18 ENCOUNTER — Other Ambulatory Visit: Payer: Self-pay

## 2022-01-18 ENCOUNTER — Emergency Department (HOSPITAL_BASED_OUTPATIENT_CLINIC_OR_DEPARTMENT_OTHER): Payer: 59

## 2022-01-18 ENCOUNTER — Encounter (HOSPITAL_COMMUNITY): Payer: Self-pay | Admitting: Emergency Medicine

## 2022-01-18 ENCOUNTER — Ambulatory Visit (INDEPENDENT_AMBULATORY_CARE_PROVIDER_SITE_OTHER): Payer: 59

## 2022-01-18 ENCOUNTER — Ambulatory Visit (HOSPITAL_COMMUNITY)
Admission: EM | Admit: 2022-01-18 | Discharge: 2022-01-18 | Disposition: A | Discharge status: Home / Self Care | Payer: 59 | Attending: Family Medicine | Admitting: Family Medicine

## 2022-01-18 ENCOUNTER — Emergency Department (HOSPITAL_BASED_OUTPATIENT_CLINIC_OR_DEPARTMENT_OTHER)
Admission: EM | Admit: 2022-01-18 | Discharge: 2022-01-18 | Disposition: A | Discharge status: Home / Self Care | Payer: 59 | Attending: Emergency Medicine | Admitting: Emergency Medicine

## 2022-01-18 ENCOUNTER — Encounter (HOSPITAL_BASED_OUTPATIENT_CLINIC_OR_DEPARTMENT_OTHER): Payer: Self-pay

## 2022-01-18 DIAGNOSIS — E860 Dehydration: Secondary | ICD-10-CM | POA: Insufficient documentation

## 2022-01-18 DIAGNOSIS — R1013 Epigastric pain: Secondary | ICD-10-CM | POA: Diagnosis not present

## 2022-01-18 DIAGNOSIS — R112 Nausea with vomiting, unspecified: Secondary | ICD-10-CM | POA: Diagnosis not present

## 2022-01-18 DIAGNOSIS — K5900 Constipation, unspecified: Secondary | ICD-10-CM | POA: Insufficient documentation

## 2022-01-18 DIAGNOSIS — R1110 Vomiting, unspecified: Secondary | ICD-10-CM | POA: Diagnosis not present

## 2022-01-18 DIAGNOSIS — R1010 Upper abdominal pain, unspecified: Secondary | ICD-10-CM | POA: Diagnosis not present

## 2022-01-18 LAB — COMPREHENSIVE METABOLIC PANEL
ALT: 14 U/L (ref 0–44)
AST: 12 U/L — ABNORMAL LOW (ref 15–41)
Albumin: 4.5 g/dL (ref 3.5–5.0)
Alkaline Phosphatase: 44 U/L (ref 38–126)
Anion gap: 9 (ref 5–15)
BUN: 10 mg/dL (ref 6–20)
CO2: 24 mmol/L (ref 22–32)
Calcium: 9 mg/dL (ref 8.9–10.3)
Chloride: 102 mmol/L (ref 98–111)
Creatinine, Ser: 1.45 mg/dL — ABNORMAL HIGH (ref 0.61–1.24)
GFR, Estimated: >60 mL/min (ref >60)
Glucose, Bld: 98 mg/dL (ref 70–99)
Potassium: 3.9 mmol/L (ref 3.5–5.1)
Sodium: 135 mmol/L (ref 135–145)
Total Bilirubin: 0.5 mg/dL (ref 0.3–1.2)
Total Protein: 7 g/dL (ref 6.5–8.1)

## 2022-01-18 LAB — CBC WITH DIFFERENTIAL/PLATELET
Abs Immature Granulocytes: 0.04 10*3/uL (ref 0.00–0.07)
Basophils Absolute: 0.1 10*3/uL (ref 0.0–0.1)
Basophils Relative: 1 %
Eosinophils Absolute: 0.6 10*3/uL — ABNORMAL HIGH (ref 0.0–0.5)
Eosinophils Relative: 6 %
HCT: 43.7 % (ref 39.0–52.0)
Hemoglobin: 14.7 g/dL (ref 13.0–17.0)
Immature Granulocytes: 0 %
Lymphocytes Relative: 36 %
Lymphs Abs: 3.7 10*3/uL (ref 0.7–4.0)
MCH: 28.5 pg (ref 26.0–34.0)
MCHC: 33.6 g/dL (ref 30.0–36.0)
MCV: 84.9 fL (ref 80.0–100.0)
Monocytes Absolute: 1 10*3/uL (ref 0.1–1.0)
Monocytes Relative: 9 %
Neutro Abs: 5.1 10*3/uL (ref 1.7–7.7)
Neutrophils Relative %: 48 %
Platelets: 329 10*3/uL (ref 150–400)
RBC: 5.15 MIL/uL (ref 4.22–5.81)
RDW: 13.8 % (ref 11.5–15.5)
WBC: 10.5 10*3/uL (ref 4.0–10.5)
nRBC: 0 % (ref 0.0–0.2)

## 2022-01-18 LAB — URINALYSIS, ROUTINE W REFLEX MICROSCOPIC
Bilirubin Urine: NEGATIVE
Glucose, UA: NEGATIVE mg/dL
Hgb urine dipstick: NEGATIVE
Ketones, ur: NEGATIVE mg/dL
Leukocytes,Ua: NEGATIVE
Nitrite: NEGATIVE
Specific Gravity, Urine: 1.026 (ref 1.005–1.030)
pH: 5 (ref 5.0–8.0)

## 2022-01-18 LAB — LIPASE, BLOOD: Lipase: <10 U/L — ABNORMAL LOW (ref 11–51)

## 2022-01-18 MED ORDER — HYDROCODONE-ACETAMINOPHEN 5-325 MG PO TABS
1.0000 | ORAL_TABLET | Freq: Four times a day (QID) | ORAL | 0 refills | Status: DC | PRN
Start: 2022-01-18 — End: 2022-07-25

## 2022-01-18 MED ORDER — ACETAMINOPHEN 500 MG PO TABS
1000.0000 mg | ORAL_TABLET | Freq: Once | ORAL | Status: AC
Start: 2022-01-18 — End: 2022-01-18
  Administered 2022-01-18: 1000 mg via ORAL
  Filled 2022-01-18: qty 2

## 2022-01-18 MED ORDER — MORPHINE SULFATE (PF) 4 MG/ML IV SOLN: 4.0000 mg | Freq: Once | INTRAVENOUS | Status: DC

## 2022-01-18 MED ORDER — METOCLOPRAMIDE HCL 10 MG PO TABS: 10.0000 mg | ORAL_TABLET | Freq: Four times a day (QID) | ORAL | 0 refills | Status: DC

## 2022-01-18 MED ORDER — IOHEXOL 300 MG/ML  SOLN
100.0000 mL | Freq: Once | INTRAMUSCULAR | Status: AC | PRN
  Administered 2022-01-18: 100 mL via INTRAVENOUS

## 2022-01-18 MED ORDER — DIPHENHYDRAMINE HCL 25 MG PO CAPS
25.0000 mg | ORAL_CAPSULE | Freq: Once | ORAL | Status: AC
Start: 2022-01-18 — End: 2022-01-18
  Administered 2022-01-18: 25 mg via ORAL
  Filled 2022-01-18: qty 1

## 2022-01-18 MED ORDER — DICYCLOMINE HCL 10 MG PO CAPS
20.0000 mg | ORAL_CAPSULE | Freq: Once | ORAL | Status: AC
  Administered 2022-01-18: 20 mg via ORAL
  Filled 2022-01-18: qty 2

## 2022-01-18 MED ORDER — DIPHENHYDRAMINE HCL 25 MG PO TABS: 25.0000 mg | ORAL_TABLET | Freq: Four times a day (QID) | ORAL | 0 refills | Status: DC | PRN

## 2022-01-18 MED ORDER — DICYCLOMINE HCL 20 MG PO TABS: 20.0000 mg | ORAL_TABLET | Freq: Two times a day (BID) | ORAL | 0 refills | Status: DC

## 2022-01-18 MED ORDER — METOCLOPRAMIDE HCL 5 MG/ML IJ SOLN
10.0000 mg | Freq: Once | INTRAMUSCULAR | Status: AC
Start: 2022-01-18 — End: 2022-01-18
  Administered 2022-01-18: 10 mg via INTRAVENOUS
  Filled 2022-01-18: qty 2

## 2022-01-18 MED ORDER — SODIUM CHLORIDE 0.9 % IV BOLUS
1000.0000 mL | Freq: Once | INTRAVENOUS | Status: AC
  Administered 2022-01-18: 1000 mL via INTRAVENOUS

## 2022-01-18 MED ORDER — OXYCODONE HCL 5 MG PO TABS: 5.0000 mg | ORAL_TABLET | ORAL | Status: DC

## 2022-01-18 NOTE — ED Notes (Signed)
Patient is being discharged from the Urgent Care and sent to the Emergency Department via POV . Per Dr Marlinda Mike, patient is in need of higher level of care due to abdominal distention. Patient is aware and verbalizes understanding of plan of care.  ?Vitals:  ? 01/18/22 0843  ?BP: 120/79  ?Pulse: (!) 116  ?Resp: 19  ?Temp: 98.1 ?F (36.7 ?C)  ?SpO2: 94%  ?  ?

## 2022-01-18 NOTE — ED Notes (Signed)
Spoke to provider about pt increased pain, pt sts that he is unable to get ride so holding morphine unless someone is able to come get him, tylenol given, pt tolerating PO fluids, provider aware ?

## 2022-01-18 NOTE — ED Notes (Signed)
Patient transported to CT 

## 2022-01-18 NOTE — Discharge Instructions (Signed)
Your work-up today has been quite reassuring.  Your CT scan did not show any evidence of intra-abdominal infection or any surgical emergency.  Follow-up with your primary care provider in the next few days to a week.  Drink plenty of water take Bentyl as needed for pain ? ?I recommend avoiding ibuprofen, naproxen, alcohol ? ?You may take Tylenol 1000 mg every 6 hours as needed for pain in addition to the Bentyl ?

## 2022-01-18 NOTE — Discharge Instructions (Addendum)
Your x-ray just showed some dilated loops of colon and small intestine.  I still think he should proceed to the emergency room for further evaluation and possible CT ?

## 2022-01-18 NOTE — ED Triage Notes (Signed)
Pt reports since Tuesday he had loose stools and 2 hours after eating with get nauseated and vomit. Today at work was eating an orange and got nauseated, dizzy and had chest pains. Friend helped him get to the nurse bc felt like going to pass out. BP was high and nurse wanted to call EMS but pt refused. Pt reports abd pains.  ?

## 2022-01-18 NOTE — ED Provider Notes (Signed)
?[facility] ?Provider Note ? ? ?CSN: [id number] ?Arrival date & time: 01/18/22  1103 ? ?  ? ?History ? ?Chief Complaint  ?Patient presents with  ? Abdominal Pain  ?  Pt sts that this morning at work he started having abd pain bilaterally in upper quads, LBM Tuesday, went to UC and xr showed dilated loops in colon and small bowel, pt a&o x4, amb to room  ? Constipation  ? ? ?Seth Booker is a 46 y.o. male. ? ? ?Abdominal Pain ?Associated symptoms: constipation   ?Constipation ?Associated symptoms: abdominal pain   ? ?Patient is a 46 year old male with past medical history significant for OSA, DDD, anxiety, tobacco use ? ?Patient is presented emergency room today with complaints of epigastric abdominal pain nausea vomiting since 3/28.  He states that 3/29 he began experiencing nausea and vomiting.  States he has had numerous episodes of vomiting.  States that his emesis occurred usually approximately 1 to 2 hours after he ate food.  He states that as a result of this he has been eating less food. ? ?Denies any chest pain states that he had a moment of this that he told urgent care about but no difficulty breathing call out hemoptysis, lightheadedness dizziness, leg swelling.  No other associate symptoms. ?  ? ?Home Medications ?Prior to Admission medications   ?Medication Sig Start Date End Date Taking? Authorizing Provider  ?dicyclomine (BENTYL) 20 MG tablet Take 1 tablet (20 mg total) by mouth 2 (two) times daily. 01/18/22  Yes Tedd Sias, PA  ?diphenhydrAMINE (BENADRYL) 25 MG tablet Take 1 tablet (25 mg total) by mouth every 6 (six) hours as needed. 01/18/22  Yes Tedd Sias, PA  ?HYDROcodone-acetaminophen (NORCO/VICODIN) 5-325 MG tablet Take 1 tablet by mouth every 6 (six) hours as needed for severe pain. 01/18/22  Yes Tedd Sias, PA  ?metoCLOPramide (REGLAN) 10 MG tablet Take 1 tablet (10 mg total) by mouth every 6 (six) hours. 01/18/22  Yes Javelle Donigan, Ova Fresh[place] S, PA   ?montelukast (SINGULAIR) 10 MG tablet Take 10 mg by mouth daily. 11/21/21   [provider]  ?SUMAtriptan (IMITREX) 20 MG/ACT nasal spray Place 1 spray (20 mg total) into the nose every 2 (two) hours as needed for migraine or headache. May repeat in 2 hours if headache persists or recurs. 09/20/21   Hans Eden, NP  ?   ? ?Allergies    ?Bee venom   ? ?Review of Systems   ?Review of Systems  ?Gastrointestinal:  Positive for abdominal pain and constipation.  ? ?Physical Exam ?Updated Vital Signs ?BP 133/90   Pulse 83   Temp 97.9 ?F (36.6 ?C) (Oral)   Resp 18   Ht 6\' 2"  (1.88 m)   Wt 125.2 kg   SpO2 99%   BMI 35.44 kg/m?  ?Physical Exam ?Vitals and nursing note reviewed.  ?Constitutional:   ?   General: He is in acute distress.  ?   Comments: Uncomfortable 46 year old male, pleasant, able answer questions appropriately and follow commands  ?HENT:  ?   Head: Normocephalic and atraumatic.  ?   Nose: Nose normal.  ?Eyes:  ?   General: No scleral icterus. ?Cardiovascular:  ?   Rate and Rhythm: Normal rate and regular rhythm.  ?   Pulses: Normal pulses.  ?   Heart sounds: Normal heart sounds.  ?Pulmonary:  ?   Effort: Pulmonary effort is normal. No respiratory distress.  ?   Breath sounds: No  wheezing.  ?Abdominal:  ?   Palpations: Abdomen is soft.  ?   Tenderness: There is abdominal tenderness in the epigastric area, periumbilical area and left lower quadrant.  ?Musculoskeletal:  ?   Cervical back: Normal range of motion.  ?   Right lower leg: No edema.  ?   Left lower leg: No edema.  ?Skin: ?   General: Skin is warm and dry.  ?   Capillary Refill: Capillary refill takes less than 2 seconds.  ?Neurological:  ?   Mental Status: He is alert. Mental status is at baseline.  ?Psychiatric:     ?   Mood and Affect: Mood normal.     ?   Behavior: Behavior normal.  ? ? ?ED Results / Procedures / Treatments   ?Labs ?(all labs ordered are listed, but only abnormal results are displayed) ?Labs Reviewed   ?COMPREHENSIVE METABOLIC PANEL - Abnormal; Notable for the following components:  ?    Result Value  ? Creatinine, Ser 1.45 (*)   ? AST 12 (*)   ? All other components within normal limits  ?LIPASE, BLOOD - Abnormal; Notable for the following components:  ? Lipase <10 (*)   ? All other components within normal limits  ?CBC WITH DIFFERENTIAL/PLATELET - Abnormal; Notable for the following components:  ? Eosinophils Absolute 0.6 (*)   ? All other components within normal limits  ?URINALYSIS, ROUTINE W REFLEX MICROSCOPIC - Abnormal; Notable for the following components:  ? Protein, ur TRACE (*)   ? All other components within normal limits  ? ? ?EKG ?None ? ?Radiology ?CT ABDOMEN PELVIS W CONTRAST ? ?Result Date: 01/18/2022 ?CLINICAL DATA:  Abdominal pain, acute, nonlocalized worst in epigastrium - sx of SBO/LBO EXAM: CT ABDOMEN AND PELVIS WITH CONTRAST TECHNIQUE: Multidetector CT imaging of the abdomen and pelvis was performed using the standard protocol following bolus administration of intravenous contrast. RADIATION DOSE REDUCTION: This exam was performed according to the departmental dose-optimization program which includes automated exposure control, adjustment of the mA and/or kV according to patient size and/or use of iterative reconstruction technique. CONTRAST:  148mL OMNIPAQUE IOHEXOL 300 MG/ML  SOLN COMPARISON:  CT 04/28/2020, 07/25/2017 FINDINGS: Lower chest: No acute abnormality. Hepatobiliary: No focal liver abnormality is seen. The gallbladder is unremarkable. Pancreas: Unremarkable. No pancreatic ductal dilatation or surrounding inflammatory changes. Spleen: Normal in size without focal abnormality. Adrenals/Urinary Tract: Adrenal glands are unremarkable. No hydronephrosis. There are punctate nonobstructive stones in the upper poles of the right and left kidneys. There is an unchanged exophytic right renal cyst measuring simple density. Unchanged small left lower pole renal cyst. The bladder is  minimally distended but unremarkable. Stomach/Bowel: The stomach is within normal limits. There is no evidence of bowel obstruction.No evidence of appendicitis. Vascular/Lymphatic: No significant vascular findings are present. No enlarged abdominal or pelvic lymph nodes. Reproductive: Unremarkable. Other: Small fat containing right inguinal hernia. No ascites. No free air. Musculoskeletal: No acute osseous abnormality. No suspicious lytic or blastic lesions. Mild bilateral hip arthritis. There is a new nodular density along the surface of the left gluteus maximus muscle which may be mildly enhancing. This measures 1.2 x 0.6 x 3.3 cm (axial image 92, sagittal image 129). This appears to be mildly enhancing. IMPRESSION: No acute abdominopelvic abnormality. Punctate nonobstructive nephrolithiasis. New mildly enhancing nodular density along the surface of the left gluteus maximus muscle, measuring 1.2 x 0.6 x 3.3 cm. Recommend further evaluation with non-emergent targeted ultrasound. Electronically Signed   By: Maurine Simmering  M.D.   On: 01/18/2022 12:53  ? ?DG Abd 2 Views ? ?Result Date: 01/18/2022 ?CLINICAL DATA:  Upper abdominal pain. EXAM: ABDOMEN - 2 VIEW COMPARISON:  None. FINDINGS: No abnormal bowel dilatation is noted. Moderate amount of stool seen in the right colon. There is no evidence of free air. No radio-opaque calculi or other significant radiographic abnormality is seen. IMPRESSION: No abnormal bowel dilatation is noted. Electronically Signed   By: Marijo Conception M.D.   On: 01/18/2022 09:37   ? ?Procedures ?Procedures  ? ? ?Medications Ordered in ED ?Medications  ?sodium chloride 0.9 % bolus 1,000 mL (0 mLs Intravenous Stopped 01/18/22 1359)  ?metoCLOPramide (REGLAN) injection 10 mg (10 mg Intravenous Given 01/18/22 1220)  ?dicyclomine (BENTYL) capsule 20 mg (20 mg Oral Given 01/18/22 1220)  ?diphenhydrAMINE (BENADRYL) capsule 25 mg (25 mg Oral Given 01/18/22 1220)  ?iohexol (OMNIPAQUE) 300 MG/ML solution 100 mL  (100 mLs Intravenous Contrast Given 01/18/22 1227)  ?acetaminophen (TYLENOL) tablet 1,000 mg (1,000 mg Oral Given 01/18/22 1319)  ? ? ?ED Course/ Medical Decision Making/ A&P ?Clinical Course as of 01/18/22 1519  ?Fri Jan 18, 2022  ?1212

## 2022-01-18 NOTE — ED Provider Notes (Signed)
?[facility] ? ? ? ?CSN: [id number] ?Arrival date & time: 01/18/22  0831 ? ? ?  ? ?History   ?Chief Complaint ?Chief Complaint  ?Patient presents with  ? Dizziness  ? Nausea  ? ? ?HPI ?Seth Booker is a 46 y.o. male.  ? ? ?Dizziness ?Here for recurrent vomiting, watery stools, and chest pain that happened today.  About 2 days ago he began having watery loose stools, but just 1 or 2 times a day.  They are actually very small in quantity.  He also has been throwing up 2 hours after eating, every time he eats in this 3-day period.  Today at work using an orange and began having chest pain, started having a headache, and felt like he was going to pass out.  At work they got the blood pressure to improve, but he still having some headache.  No chest pain now.  He is still having some upper abdominal pain. ? ?History reviewed. No pertinent past medical history. ? ?Patient Active Problem List  ? Diagnosis Date Noted  ? Obstructive apnea 12/27/2013  ? Headache, migraine 08/14/2013  ? DDD (degenerative disc disease), cervical 03/27/2013  ? Affective disorder (HCC) 03/27/2013  ? Anxiety, generalized 03/27/2013  ? Compulsive tobacco user syndrome 03/27/2013  ? ? ?Past Surgical History:  ?Procedure Laterality Date  ? ADENOIDECTOMY    ? KNEE SURGERY    ? NECK SURGERY    ? TONSILLECTOMY    ? ? ? ? ? ?Home Medications   ? ?Prior to Admission medications   ?Medication Sig Start Date End Date Taking? Authorizing Provider  ?montelukast (SINGULAIR) 10 MG tablet Take 10 mg by mouth daily. 11/21/21  Yes [provider]  ?SUMAtriptan (IMITREX) 20 MG/ACT nasal spray Place 1 spray (20 mg total) into the nose every 2 (two) hours as needed for migraine or headache. May repeat in 2 hours if headache persists or recurs. 09/20/21   Valinda Hoar, NP  ? ? ?Family History ?No family history on file. ? ?Social History ?Social History  ? ?Tobacco Use  ? Smoking status: Never  ? Smokeless tobacco: Current  ?  Types: Chew, Snuff   ?Substance Use Topics  ? Alcohol use: Yes  ? Drug use: No  ? ? ? ?Allergies   ?Bee venom ? ? ?Review of Systems ?Review of Systems  ?Neurological:  Positive for dizziness.  ? ? ?Physical Exam ?Triage Vital Signs ?ED Triage Vitals  ?Enc Vitals Group  ?   BP 01/18/22 0843 120/79  ?   Pulse Rate 01/18/22 0843 (!) 116  ?   Resp 01/18/22 0843 19  ?   Temp 01/18/22 0843 98.1 ?F (36.7 ?C)  ?   Temp Source 01/18/22 0843 Oral  ?   SpO2 01/18/22 0843 94 %  ?   Weight --   ?   Height --   ?   Head Circumference --   ?   Peak Flow --   ?   Pain Score 01/18/22 0841 3  ?   Pain Loc --   ?   Pain Edu? --   ?   Excl. in GC? --   ? ?No data found. ? ?Updated Vital Signs ?BP 120/79 (BP Location: Right Arm)   Pulse (!) 116   Temp 98.1 ?F (36.7 ?C) (Oral)   Resp 19   SpO2 94%  ? ?Visual Acuity ?Right Eye Distance:   ?Left Eye Distance:   ?Bilateral Distance:   ? ?  Right Eye Near:   ?Left Eye Near:    ?Bilateral Near:    ? ?Physical Exam ?Vitals reviewed.  ?Constitutional:   ?   General: He is not in acute distress. ?   Appearance: He is not ill-appearing, toxic-appearing or diaphoretic.  ?HENT:  ?   Mouth/Throat:  ?   Mouth: Mucous membranes are moist.  ?Eyes:  ?   Extraocular Movements: Extraocular movements intact.  ?   Pupils: Pupils are equal, round, and reactive to light.  ?Cardiovascular:  ?   Rate and Rhythm: Normal rate and regular rhythm.  ?   Heart sounds: No murmur heard. ?Pulmonary:  ?   Effort: No respiratory distress.  ?   Breath sounds: No stridor. No wheezing, rhonchi or rales.  ?Chest:  ?   Chest wall: No tenderness.  ?Abdominal:  ?   Comments: Abdomen is hard and is tender in the epigastric area.  ?Musculoskeletal:  ?   Cervical back: Neck supple.  ?Lymphadenopathy:  ?   Cervical: No cervical adenopathy.  ? ? ? ?UC Treatments / Results  ?Labs ?(all labs ordered are listed, but only abnormal results are displayed) ?Labs Reviewed - No data to display ? ?EKG ? ? ?Radiology ?No results found. ? ?Procedures ?Procedures  (including critical care time) ? ?Medications Ordered in UC ?Medications - No data to display ? ?Initial Impression / Assessment and Plan / UC Course  ?I have reviewed the triage vital signs and the nursing notes. ? ?Pertinent labs & imaging results that were available during my care of the patient were reviewed by me and considered in my medical decision making (see chart for details). ? ?  ? ?X-ray only shows the air-fluid level in the stomach.  There is some dilated colon.  And also dilated large loop of small intestine noted.  There is some stool on the right side of the abdomen and there appears to be gas down into the rectum. ?On reexamination, there is more firmness now in the right side of his abdomen.  He still very tender over the epigastrium.  Not really tympanitic.  With his persistent symptoms and abnormal exam, I still think he probably needs some advanced imaging of his abdomen.  I have asked him to proceed to the ER for further evaluation ?Final Clinical Impressions(s) / UC Diagnoses  ? ?Final diagnoses:  ?None  ? ?Discharge Instructions   ?None ?  ? ?ED Prescriptions   ?None ?  ? ?PDMP not reviewed this encounter. ?  ?Zenia Resides, MD ?01/18/22 [phone] ? ?

## 2022-06-16 IMAGING — DX DG ABDOMEN 2V
4 series · 4 of 4 positions shown · non-contrast
Comparison: None.

CLINICAL DATA: Upper abdominal pain.

EXAM:
ABDOMEN - 2 VIEW

[abdomen erect (1 of 2)]
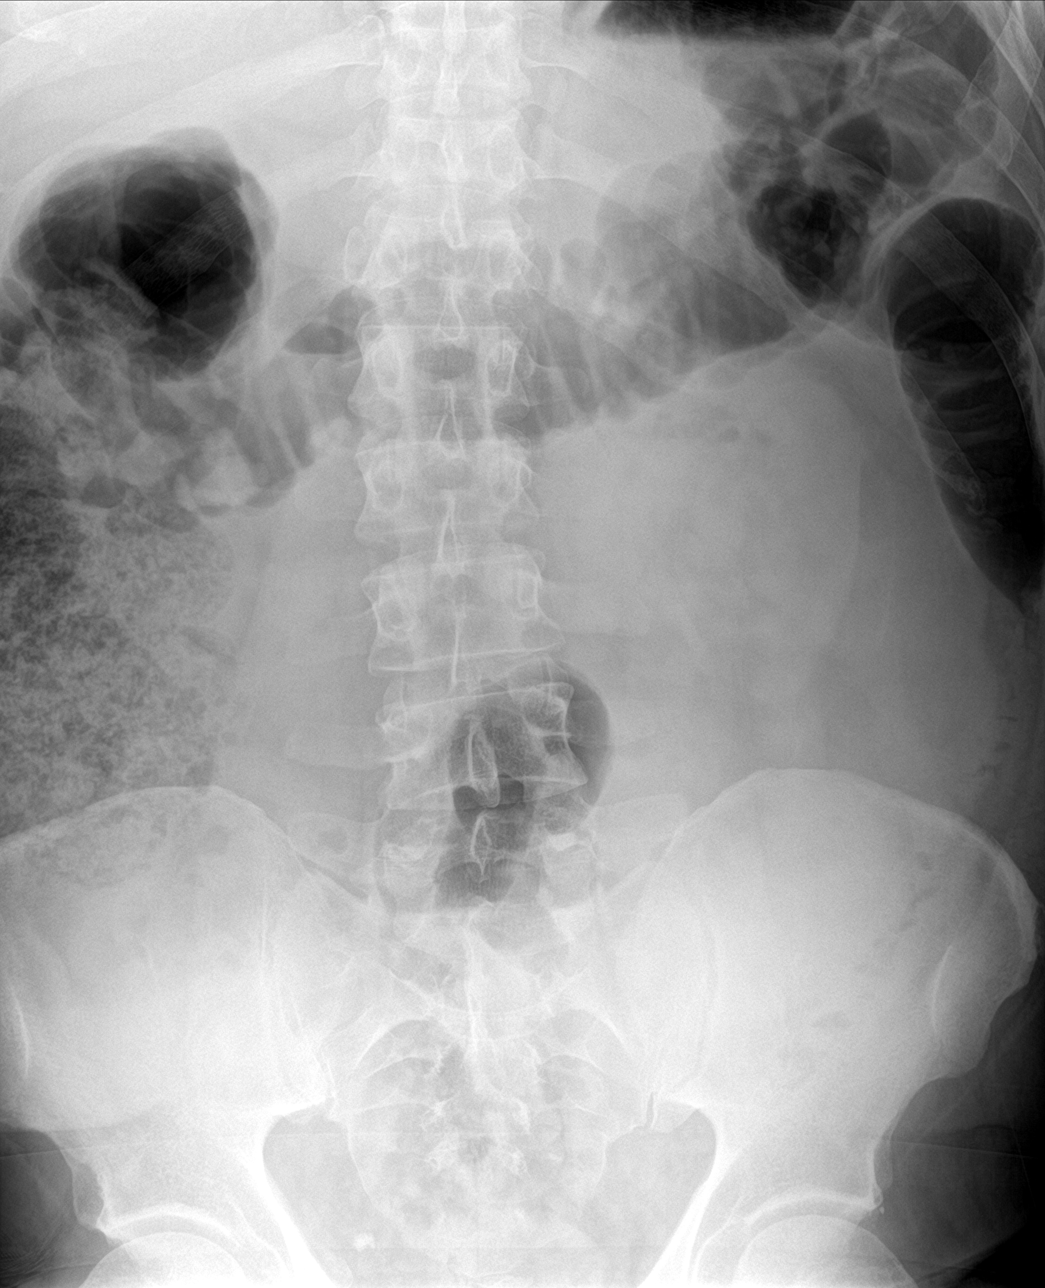

[abdomen supine (1 of 2)]
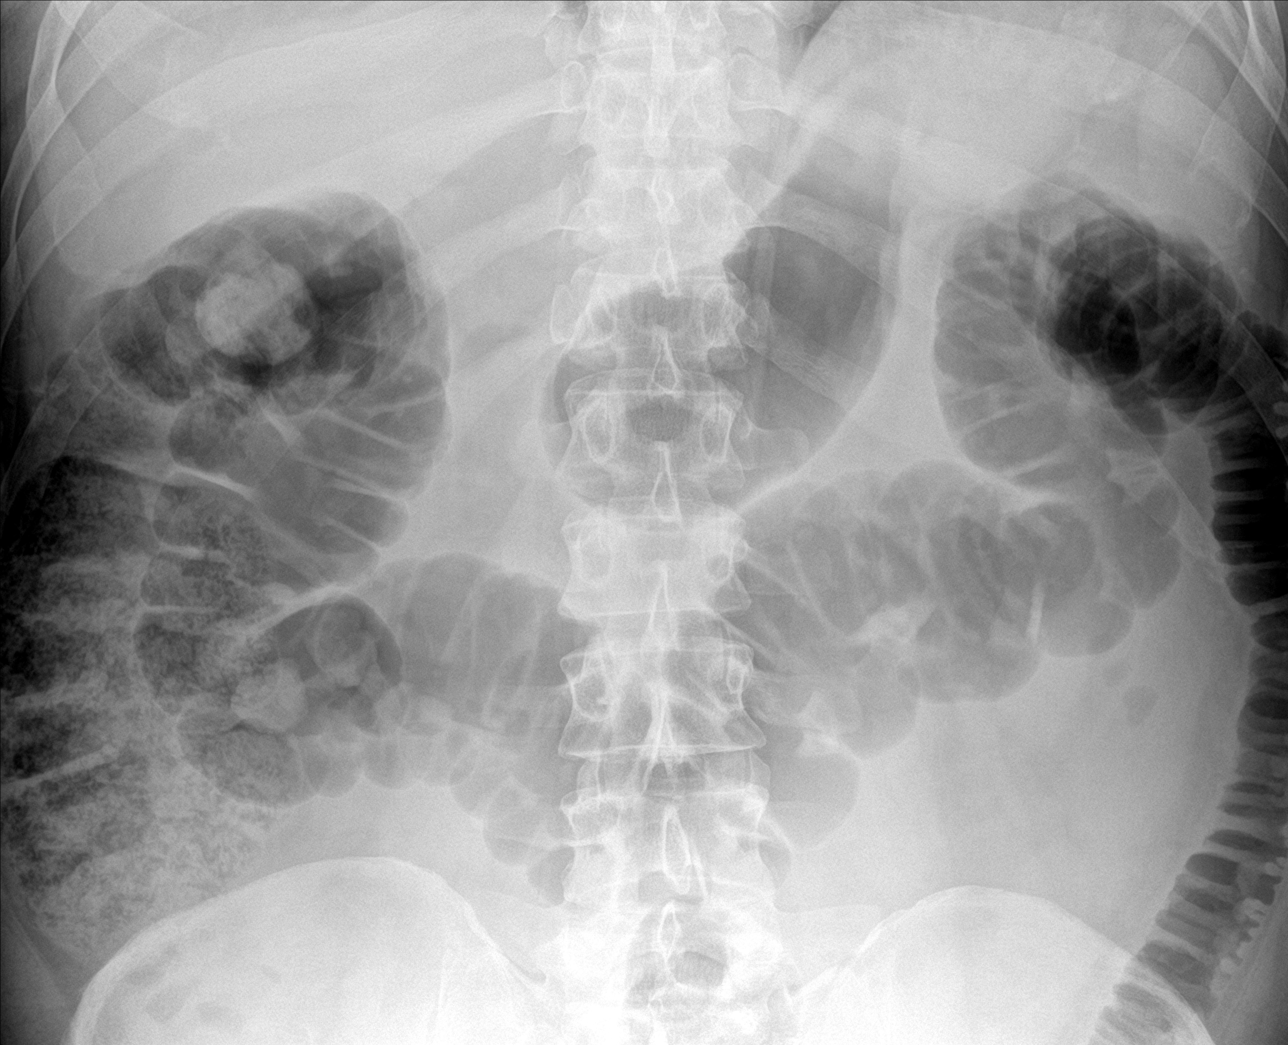

[abdomen supine (2 of 2)]
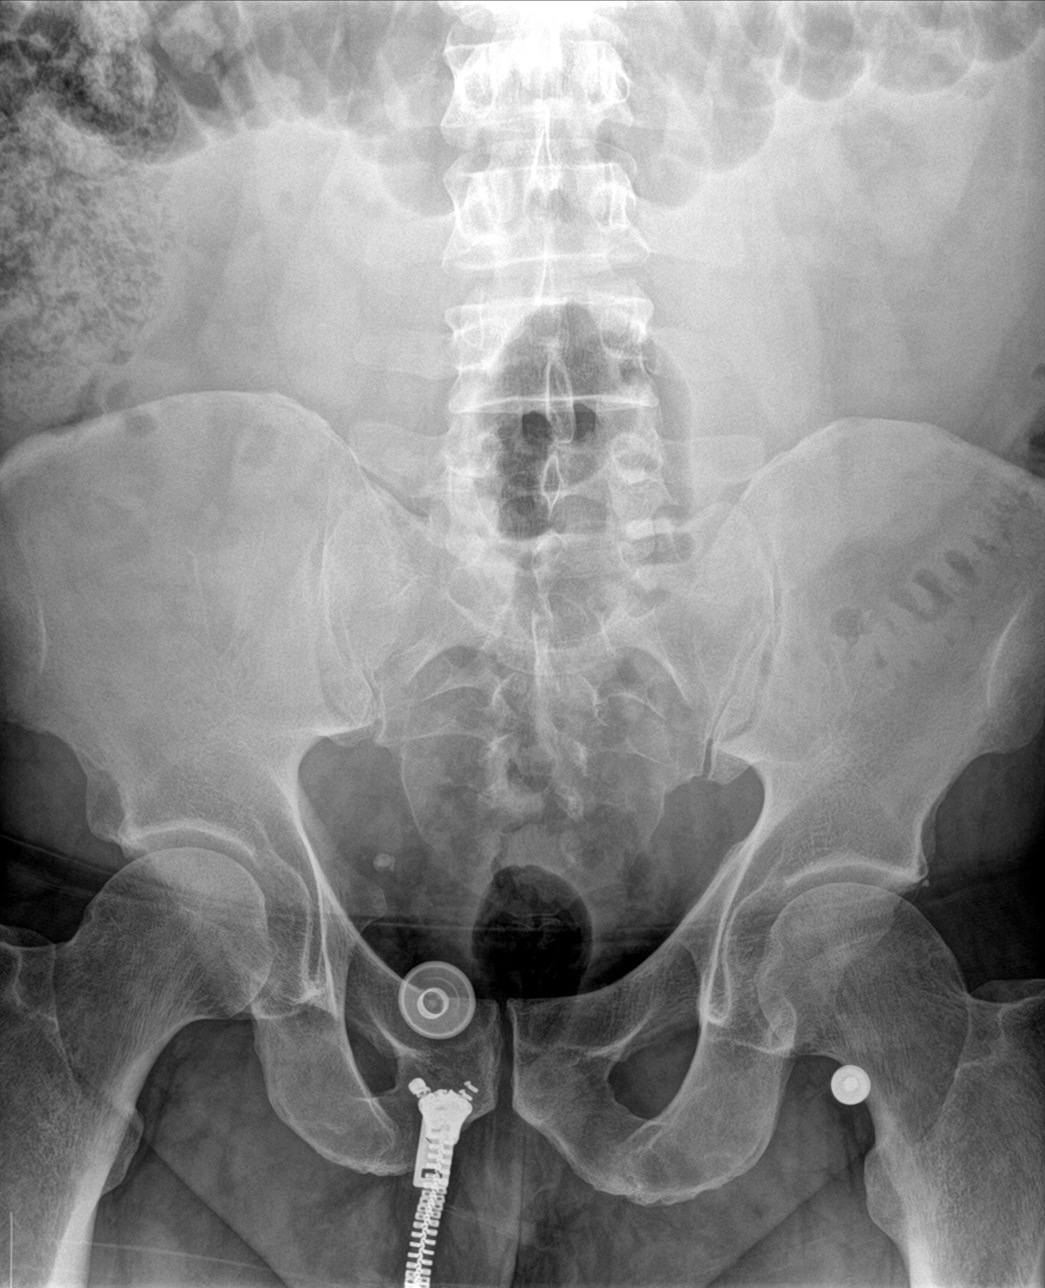

[abdomen erect (2 of 2)]
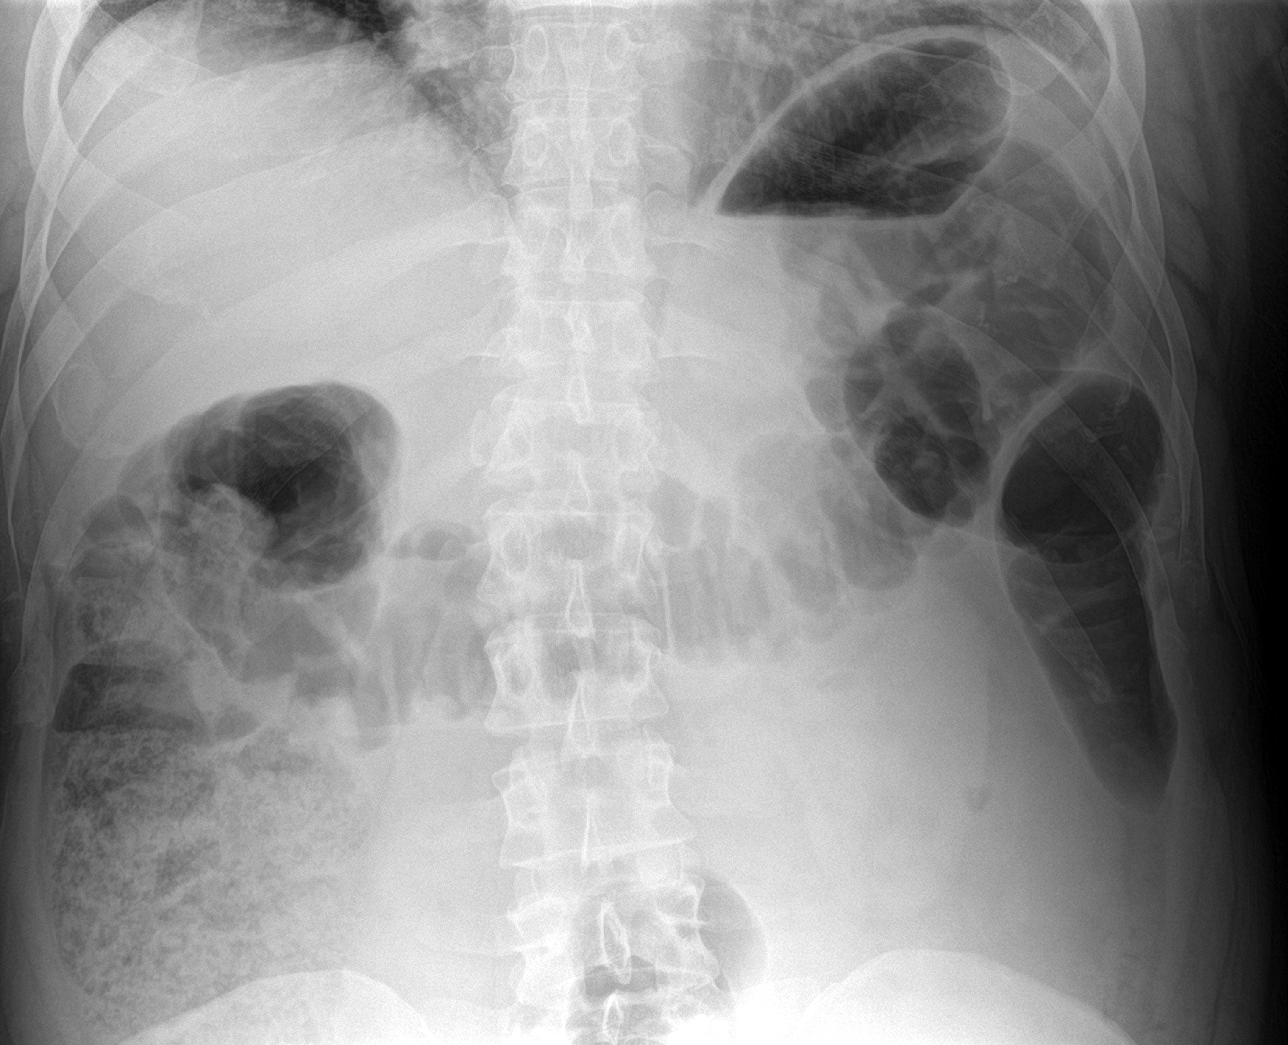

[4 of 4 positions shown; findings below may reference images not displayed]

FINDINGS: No abnormal bowel dilatation is noted. Moderate amount of stool seen
in the right colon. There is no evidence of free air. No
radio-opaque calculi or other significant radiographic abnormality
is seen.
IMPRESSION: No abnormal bowel dilatation is noted.

## 2022-07-25 ENCOUNTER — Ambulatory Visit (INDEPENDENT_AMBULATORY_CARE_PROVIDER_SITE_OTHER): Payer: 59 | Admitting: Nurse Practitioner

## 2022-07-25 ENCOUNTER — Ambulatory Visit (INDEPENDENT_AMBULATORY_CARE_PROVIDER_SITE_OTHER)
Admission: RE | Admit: 2022-07-25 | Discharge: 2022-07-25 | Disposition: A | Discharge status: Home / Self Care | Payer: 59 | Source: Ambulatory Visit | Attending: Nurse Practitioner | Admitting: Nurse Practitioner

## 2022-07-25 ENCOUNTER — Encounter: Payer: Self-pay | Admitting: Nurse Practitioner

## 2022-07-25 VITALS — BP 118/76 | HR 96 | Temp 97.6°F | Resp 12 | Ht 74.0 in | Wt 244.2 lb

## 2022-07-25 DIAGNOSIS — G4733 Obstructive sleep apnea (adult) (pediatric): Secondary | ICD-10-CM

## 2022-07-25 DIAGNOSIS — G4709 Other insomnia: Secondary | ICD-10-CM | POA: Insufficient documentation

## 2022-07-25 DIAGNOSIS — Z9889 Other specified postprocedural states: Secondary | ICD-10-CM

## 2022-07-25 DIAGNOSIS — G43109 Migraine with aura, not intractable, without status migrainosus: Secondary | ICD-10-CM

## 2022-07-25 DIAGNOSIS — R202 Paresthesia of skin: Secondary | ICD-10-CM

## 2022-07-25 DIAGNOSIS — Z9109 Other allergy status, other than to drugs and biological substances: Secondary | ICD-10-CM | POA: Insufficient documentation

## 2022-07-25 LAB — TSH: TSH: 0.29 u[IU]/mL — ABNORMAL LOW (ref 0.35–5.50)

## 2022-07-25 LAB — COMPREHENSIVE METABOLIC PANEL
ALT: 12 U/L (ref 0–53)
AST: 10 U/L (ref 0–37)
Albumin: 4.5 g/dL (ref 3.5–5.2)
Alkaline Phosphatase: 45 U/L (ref 39–117)
BUN: 9 mg/dL (ref 6–23)
CO2: 24 mEq/L (ref 19–32)
Calcium: 9.5 mg/dL (ref 8.4–10.5)
Chloride: 106 mEq/L (ref 96–112)
Creatinine, Ser: 1.19 mg/dL (ref 0.40–1.50)
GFR: 73.37 mL/min (ref >60.00)
Glucose, Bld: 94 mg/dL (ref 70–99)
Potassium: 4.4 mEq/L (ref 3.5–5.1)
Sodium: 138 mEq/L (ref 135–145)
Total Bilirubin: 0.7 mg/dL (ref 0.2–1.2)
Total Protein: 7 g/dL (ref 6.0–8.3)

## 2022-07-25 LAB — CBC
HCT: 47.1 % (ref 39.0–52.0)
Hemoglobin: 15.9 g/dL (ref 13.0–17.0)
MCHC: 33.8 g/dL (ref 30.0–36.0)
MCV: 89.8 fl (ref 78.0–100.0)
Platelets: 375 10*3/uL (ref 150.0–400.0)
RBC: 5.25 Mil/uL (ref 4.22–5.81)
RDW: 13.3 % (ref 11.5–15.5)
WBC: 11.8 10*3/uL — ABNORMAL HIGH (ref 4.0–10.5)

## 2022-07-25 MED ORDER — HYDROXYZINE PAMOATE 25 MG PO CAPS: 25.0000 mg | ORAL_CAPSULE | Freq: Two times a day (BID) | ORAL | 0 refills | Status: DC | PRN

## 2022-07-25 NOTE — Assessment & Plan Note (Signed)
History of cervical surgery through [place] [facility].  Patient is beginning to have increased neck pain with constant paresthesia.  Ambulatory referral to [place] [facility] placed today along with cervical films, pending result

## 2022-07-25 NOTE — Progress Notes (Signed)
New Patient Office Visit  Subjective    Patient ID: Seth Booker, male    DOB: 04-25-1976  Age: 45 y.o. MRN: [id number]  CC:  Chief Complaint  Patient presents with   Establish Care    No previous PCP just went to Urgent Care-did see Allergist at [facility] in [place]   Insomnia    Anxiety and sleeping issues has gotten worse since separation about 1 1/2 months ago. He does go to therapy x 5 years.   Referral    To allergist for allergy shots to continue. Also neurosurgeon, patient has numbness in his left pinky finger and has had cervical surgery. [facility] who did his surgery in the past    HPI Seth Booker presents to establish care   OSA: CPAP states that he would get 6-8 ours with cpap. [place] provider manages his OSA  Migraine: once every 3 months. States the imtrix does help.Bilateral frontal headache. Sentitivey to lie, nausea and vomiting. Rainbow and blurry vision piror to onset of headache.  Insomnia: states that he has tried benadryl tylenol pm. Has tried melatonin. No history of medicaoitns. States he is doing weekly therapy. Seth Booker is his therapist name she works at [employer] counseling in    Allergies: Has been seen by [place] and was getting allergy shots. Needs allergy referral.  Patient does have EpiPen if needed.  Patient is never had to use EpiPen at home.  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 States that it has been 3 months constantly. States that he was seeing [facility].  Patient is right-handed  Several patient: has been together for 17 years and married for 15 years.  States they just speperated 1.5 months ago  Testosterone replacement therapy: Patient is followed by [facility] and gets testosterone pellets placed with lab work periodically. Outpatient Encounter Medications as of 07/25/2022  Medication Sig   EPINEPHrine  0.3 mg/0.3 mL IJ SOAJ injection INJECT 1 PEN IN THE MUSCLE ONE TIME AS DIRECTED PER INSTRUCTIONS IN PACK   fluticasone (FLONASE) 50 MCG/ACT nasal spray SHAKE LIQUID AND USE 2 SPRAYS IN EACH NOSTRIL DAILY   hydrOXYzine (VISTARIL) 25 MG capsule Take 1 capsule (25 mg total) by mouth 2 (two) times daily as needed.   SUMAtriptan (IMITREX) 20 MG/ACT nasal spray Place 1 spray (20 mg total) into the nose every 2 (two) hours as needed for migraine or headache. May repeat in 2 hours if headache persists or recurs.   [DISCONTINUED] dicyclomine (BENTYL) 20 MG tablet Take 1 tablet (20 mg total) by mouth 2 (two) times daily.   [DISCONTINUED] diphenhydrAMINE (BENADRYL) 25 MG tablet Take 1 tablet (25 mg total) by mouth every 6 (six) hours as needed.   [DISCONTINUED] HYDROcodone-acetaminophen (NORCO/VICODIN) 5-325 MG tablet Take 1 tablet by mouth every 6 (six) hours as needed for severe pain.   [DISCONTINUED] metoCLOPramide (REGLAN) 10 MG tablet Take 1 tablet (10 mg total) by mouth every 6 (six) hours.   [DISCONTINUED] montelukast (SINGULAIR) 10 MG tablet Take 10 mg by mouth daily.   No facility-administered encounter medications on file as of 07/25/2022.    Past Medical History:  Diagnosis Date   Anxiety    DDD (degenerative disc disease), cervical    History of drug abuse (HCC)    Migraine    Sleep apnea     Past Surgical History:  Procedure Laterality  Date   ADENOIDECTOMY     KNEE SURGERY Left    x 2 arthroscopic   NECK SURGERY     2 disc fused and titanium cage put in, C4 and C5   TONSILLECTOMY     URETHRA SURGERY      Family History  Problem Relation Age of Onset   Breast cancer Mother    Thyroid cancer Mother    COPD Father    Emphysema Father    Juvenile Diabetes Sister     Social History   Socioeconomic History   Marital status: Legally Separated    Spouse name: Not on file   Number of children: 2   Years of education: Not on file   Highest education level: Not on file   Occupational History   Not on file  Tobacco Use   Smoking status: Never   Smokeless tobacco: Current    Types: Chew, Snuff   Tobacco comments:    Dip tobacco use-1 can a day  Vaping Use   Vaping Use: Never used  Substance and Sexual Activity   Alcohol use: Yes    Comment: 3 to 5 drinks a week-on the weekend usually   Drug use: Not Currently    Types: GHB, Cocaine, Fentanyl, Heroin, MDMA (Ecstacy), Ketamine    Comment: sober since 2018   Sexual activity: Not Currently  Other Topics Concern   Not on file  Social History Narrative   Seth Booker  8 Seth Booker 16      ITG: fulltime cigarette company    Social Determinants of Corporate investment banker Strain: Not on file  Food Insecurity: Not on file  Transportation Needs: Not on file  Physical Activity: Not on file  Stress: Not on file  Social Connections: Not on file  Intimate Partner Violence: Not on file    Review of Systems  Constitutional:  Negative for chills and fever.  Respiratory:  Negative for shortness of breath.   Cardiovascular:  Negative for chest pain.  Gastrointestinal:  Negative for abdominal pain, constipation, diarrhea, nausea and vomiting.       BM daily   Genitourinary:  Negative for dysuria and hematuria.  Neurological:  Positive for tingling and headaches.  Psychiatric/Behavioral:  Negative for hallucinations and suicidal ideas.         Objective    BP 118/76   Pulse 96   Temp 97.6 F (36.4 C)   Resp 12   Ht 6\' 2"  (1.88 m)   Wt 244 lb 4 oz (110.8 kg)   SpO2 98%   BMI 31.36 kg/m   Physical Exam Vitals and nursing note reviewed.  Constitutional:      Appearance: Normal appearance.  HENT:     Right Ear: Tympanic membrane, ear canal and external ear normal.     Left Ear: Tympanic membrane, ear canal and external ear normal.     Mouth/Throat:     Mouth: Mucous membranes are moist.     Pharynx: Oropharynx is clear.  Eyes:     Extraocular Movements: Extraocular movements intact.      Pupils: Pupils are equal, round, and reactive to light.  Cardiovascular:     Rate and Rhythm: Normal rate and regular rhythm.     Heart sounds: Normal heart sounds.  Pulmonary:     Effort: Pulmonary effort is normal.     Breath sounds: Normal breath sounds.  Musculoskeletal:     Right lower leg: No edema.     Left lower  leg: No edema.  Lymphadenopathy:     Cervical: No cervical adenopathy.  Skin:    General: Skin is warm.  Neurological:     General: No focal deficit present.     Mental Status: He is alert.     Comments: Bilateral upper and lower strength 5/5  Psychiatric:        Behavior: Behavior normal.        Thought Content: Thought content normal.        Judgment: Judgment normal.         Assessment & Plan:   Problem List Items Addressed This Visit       Cardiovascular and Mediastinum   Headache, migraine    Infrequent in occurrence.  Uses Imitrex nasal spray that is beneficial.  Patient does not need a refill today.  Continue Imitrex nasal spray as recommended and directed.      Relevant Medications   EPINEPHrine 0.3 mg/0.3 mL IJ SOAJ injection     Respiratory   Obstructive apnea    Patient continues doing CPAP as directed.  Follow-up with CPAP provider as recommended        Other   History of cervical spinal surgery - Primary    History of cervical surgery through [place] [facility].  Patient is beginning to have increased neck pain with constant paresthesia.  Ambulatory referral to [place] [facility] placed today along with cervical films, pending result      Relevant Orders   DG Cervical Spine Complete   Ambulatory referral to Neurosurgery   Paresthesia    Pending cervical spine films.  Ambulatory referral made to [place] [facility].      Relevant Orders   CBC   Comprehensive metabolic panel   DG Cervical Spine Complete   Ambulatory referral to Neurosurgery   Other insomnia    Likely secondary to stressors.  Patient has been together  with former spouse for 17 years with recent separation.  He does have OSA but uses CPAP.  We will trial patient on hydroxyzine 25 mg twice daily as needed.  Sedation precautions reviewed      Relevant Medications   hydrOXYzine (VISTARIL) 25 MG capsule   Other Relevant Orders   CBC   Comprehensive metabolic panel   TSH   Environmental allergies    History of allergy testing with subsequent allergy shots.  Patient was being followed at [facility] but the allergist they are left.  Ambulatory referral made to allergy and asthma in [facility]      Relevant Orders   Ambulatory referral to Allergy    Return in about 4 weeks (around 08/22/2022) for Medication recheck .   Romilda Garret, NP

## 2022-07-25 NOTE — Assessment & Plan Note (Signed)
Patient continues doing CPAP as directed.  Follow-up with CPAP provider as recommended

## 2022-07-25 NOTE — Assessment & Plan Note (Signed)
Pending cervical spine films.  Ambulatory referral made to [place] [facility].

## 2022-07-25 NOTE — Patient Instructions (Signed)
Nice to see you today I will be in touch with the labs and xray result once I have them Follow up with me in 1 month for a recheck on anxiety and sleep, sooner If you need me

## 2022-07-25 NOTE — Assessment & Plan Note (Signed)
History of allergy testing with subsequent allergy shots.  Patient was being followed at [facility] but the allergist they are left.  Ambulatory referral made to allergy and asthma in [facility]

## 2022-07-25 NOTE — Assessment & Plan Note (Signed)
Infrequent in occurrence.  Uses Imitrex nasal spray that is beneficial.  Patient does not need a refill today.  Continue Imitrex nasal spray as recommended and directed.

## 2022-07-25 NOTE — Assessment & Plan Note (Signed)
Likely secondary to stressors.  Patient has been together with former spouse for 17 years with recent separation.  He does have OSA but uses CPAP.  We will trial patient on hydroxyzine 25 mg twice daily as needed.  Sedation precautions reviewed

## 2022-07-26 ENCOUNTER — Other Ambulatory Visit (INDEPENDENT_AMBULATORY_CARE_PROVIDER_SITE_OTHER): Payer: 59

## 2022-07-26 DIAGNOSIS — R7989 Other specified abnormal findings of blood chemistry: Secondary | ICD-10-CM | POA: Diagnosis not present

## 2022-07-26 LAB — T3, FREE: T3, Free: 4.1 pg/mL (ref 2.3–4.2)

## 2022-07-26 LAB — T4, FREE: Free T4: 0.95 ng/dL (ref 0.60–1.60)

## 2022-07-30 ENCOUNTER — Ambulatory Visit (HOSPITAL_COMMUNITY)
Admission: EM | Admit: 2022-07-30 | Discharge: 2022-07-30 | Disposition: A | Discharge status: Home / Self Care | Payer: 59 | Attending: Emergency Medicine | Admitting: Emergency Medicine

## 2022-07-30 ENCOUNTER — Encounter (HOSPITAL_COMMUNITY): Payer: Self-pay

## 2022-07-30 DIAGNOSIS — Z202 Contact with and (suspected) exposure to infections with a predominantly sexual mode of transmission: Secondary | ICD-10-CM

## 2022-07-30 DIAGNOSIS — Z113 Encounter for screening for infections with a predominantly sexual mode of transmission: Secondary | ICD-10-CM | POA: Insufficient documentation

## 2022-07-30 LAB — HIV ANTIBODY (ROUTINE TESTING W REFLEX): HIV Screen 4th Generation wRfx: NONREACTIVE

## 2022-07-30 NOTE — ED Triage Notes (Signed)
Patient states wife was cheating on him for 3 months. Wanting full panel STD testing. No current symptoms.

## 2022-07-30 NOTE — ED Provider Notes (Signed)
[facility]    CSN: [id number] Arrival date & time: 07/30/22  1645      History   Chief Complaint Chief Complaint  Patient presents with   Exposure to STD    HPI Seth Booker is a 46 y.o. male.   Patient presents today for routine STI screening.  Denies all symptoms.  Endorses infidelity of partner but no known exposure.  Past Medical History:  Diagnosis Date   Anxiety    DDD (degenerative disc disease), cervical    History of drug abuse ([place])    Migraine    Sleep apnea     Patient Active Problem List   Diagnosis Date Noted   History of cervical spinal surgery 07/25/2022   Paresthesia 07/25/2022   Other insomnia 07/25/2022   Environmental allergies 07/25/2022   Obstructive apnea 12/27/2013   Headache, migraine 08/14/2013   DDD (degenerative disc disease), cervical 03/27/2013   Affective disorder ([place]) 03/27/2013   Anxiety, generalized 03/27/2013   Compulsive tobacco user syndrome 03/27/2013    Past Surgical History:  Procedure Laterality Date   ADENOIDECTOMY     KNEE SURGERY Left    x 2 arthroscopic   NECK SURGERY     2 disc fused and titanium cage put in, C4 and C5   TONSILLECTOMY     URETHRA SURGERY         Home Medications    Prior to Admission medications   Medication Sig Start Date End Date Taking? Authorizing Provider  EPINEPHrine 0.3 mg/0.3 mL IJ SOAJ injection INJECT 1 PEN IN THE MUSCLE ONE TIME AS DIRECTED PER INSTRUCTIONS IN PACK 11/22/21   [provider]  fluticasone (FLONASE) 50 MCG/ACT nasal spray SHAKE LIQUID AND USE 2 SPRAYS IN EACH NOSTRIL DAILY 11/21/21   [provider]  hydrOXYzine (VISTARIL) 25 MG capsule Take 1 capsule (25 mg total) by mouth 2 (two) times daily as needed. 07/25/22   Michela Pitcher, NP  SUMAtriptan (IMITREX) 20 MG/ACT nasal spray Place 1 spray (20 mg total) into the nose every 2 (two) hours as needed for migraine or headache. May repeat in 2 hours if headache persists or recurs. 09/20/21    Hans Eden, NP    Family History Family History  Problem Relation Age of Onset   Breast cancer Mother    Thyroid cancer Mother    COPD Father    Emphysema Father    Juvenile Diabetes Sister     Social History Social History   Tobacco Use   Smoking status: Never   Smokeless tobacco: Current    Types: Chew, Snuff   Tobacco comments:    Dip tobacco use-1 can a day  Vaping Use   Vaping Use: Never used  Substance Use Topics   Alcohol use: Yes    Comment: 3 to 5 drinks a week-on the weekend usually   Drug use: Not Currently    Types: GHB, Cocaine, Fentanyl, Heroin, MDMA (Ecstacy), Ketamine    Comment: sober since 2018     Allergies   Bee venom   Review of Systems Review of Systems  Constitutional: Negative.   Respiratory: Negative.    Genitourinary: Negative.   Skin: Negative.   Neurological: Negative.      Physical Exam Triage Vital Signs ED Triage Vitals  Enc Vitals Group     BP 07/30/22 1746 (!) 153/84     Pulse Rate 07/30/22 1746 84     Resp 07/30/22 1746 16  Temp 07/30/22 1746 98 F (36.7 C)     Temp Source 07/30/22 1746 Oral     SpO2 07/30/22 1746 98 %     Weight --      Height --      Head Circumference --      Peak Flow --      Pain Score 07/30/22 1748 0     Pain Loc --      Pain Edu? --      Excl. in GC? --    No data found.  Updated Vital Signs BP (!) 153/84 (BP Location: Left Arm)   Pulse 84   Temp 98 F (36.7 C) (Oral)   Resp 16   SpO2 98%   Visual Acuity Right Eye Distance:   Left Eye Distance:   Bilateral Distance:    Right Eye Near:   Left Eye Near:    Bilateral Near:     Physical Exam Constitutional:      Appearance: Normal appearance.  Eyes:     Extraocular Movements: Extraocular movements intact.  Pulmonary:     Effort: Pulmonary effort is normal.  Genitourinary:    Comments: deferred Neurological:     Mental Status: He is alert and oriented to person, place, and time. Mental status is at baseline.       UC Treatments / Results  Labs (all labs ordered are listed, but only abnormal results are displayed) Labs Reviewed  HIV ANTIBODY (ROUTINE TESTING W REFLEX)  RPR  CYTOLOGY, (ORAL, ANAL, URETHRAL) ANCILLARY ONLY    EKG   Radiology No results found.  Procedures Procedures (including critical care time)  Medications Ordered in UC Medications - No data to display  Initial Impression / Assessment and Plan / UC Course  I have reviewed the triage vital signs and the nursing notes.  Pertinent labs & imaging results that were available during my care of the patient were reviewed by me and considered in my medical decision making (see chart for details).  Routine screening for STI   STI labs pending will treat per protocol, advised abstinence until lab results, and/or treatment is complete, advised condom use during all sexual encounters moving, may follow-up with urgent care as needed  Final Clinical Impressions(s) / UC Diagnoses   Final diagnoses:  Routine screening for STI (sexually transmitted infection)     Discharge Instructions      Labs pending 2-3 days, you will be contacted if positive for any sti and treatment will be sent to the pharmacy, you will have to return to the clinic if positive for gonorrhea to receive treatment   Please refrain from having sex until labs results, if positive please refrain from having sex until treatment complete and symptoms resolve   If positive for HIV, Syphilis, Chlamydia  gonorrhea or trichomoniasis please notify partner or partners so they may tested as well  Moving forward, it is recommended you use some form of protection against the transmission of sti infections  such as condoms or dental dams with each sexual encounter     ED Prescriptions   None    PDMP not reviewed this encounter.   Valinda Hoar, NP 07/30/22 1756

## 2022-07-30 NOTE — Discharge Instructions (Addendum)
Labs pending 2-3 days, you will be contacted if positive for any sti and treatment will be sent to the pharmacy, you will have to return to the clinic if positive for gonorrhea to receive treatment   Please refrain from having sex until labs results, if positive please refrain from having sex until treatment complete and symptoms resolve   If positive for HIV, Syphilis, Chlamydia  gonorrhea or trichomoniasis please notify partner or partners so they may tested as well  Moving forward, it is recommended you use some form of protection against the transmission of sti infections  such as condoms or dental dams with each sexual encounter   

## 2022-07-31 ENCOUNTER — Ambulatory Visit: Payer: 59 | Admitting: Nurse Practitioner

## 2022-07-31 LAB — CYTOLOGY, (ORAL, ANAL, URETHRAL) ANCILLARY ONLY
Chlamydia: NEGATIVE
Comment: NEGATIVE
Comment: NEGATIVE
Comment: NORMAL
Neisseria Gonorrhea: NEGATIVE
Trichomonas: NEGATIVE

## 2022-07-31 LAB — RPR: RPR Ser Ql: NONREACTIVE

## 2022-08-06 ENCOUNTER — Telehealth: Payer: Self-pay | Admitting: Nurse Practitioner

## 2022-08-06 DIAGNOSIS — Z9109 Other allergy status, other than to drugs and biological substances: Secondary | ICD-10-CM

## 2022-08-06 NOTE — Telephone Encounter (Signed)
Patient would like to know if Montelukast could be called in for him for his allergies? He said that he spoke with [facility] regarding this at his last appointment.

## 2022-08-07 MED ORDER — MONTELUKAST SODIUM 10 MG PO TABS: 10.0000 mg | ORAL_TABLET | Freq: Every day | ORAL | 2 refills | Status: DC

## 2022-08-07 NOTE — Addendum Note (Signed)
Addended by: Michela Pitcher on: 08/07/2022 01:52 PM   Modules accepted: Orders

## 2022-08-07 NOTE — Telephone Encounter (Signed)
Patient can not say if the medication has helped. He is having runny nose, sneezing and head congestion still. He is working on getting with Production manager to get back on allergy shots. Wanted to try Singulair in the meantime.

## 2022-08-07 NOTE — Telephone Encounter (Signed)
Can we see if he is using the hydroxyzine and if it has helped with allergies. I know we are using it for sleep and anxiety but it is an antihistamine. He was also suppose to have a month follow up and is not scheduled.

## 2022-08-13 NOTE — Progress Notes (Unsigned)
New Patient Note  RE: Seth Booker MRN: [id number] DOB: Dec 13, 1975 Date of Office Visit: 08/14/2022  Consult requested by: Eden Emms, NP Primary care provider: Eden Emms, NP  Chief Complaint: No chief complaint on file.  History of Present Illness: I had the pleasure of seeing Seth Booker for initial evaluation at the [facility] on 08/13/2022. He is a 46 y.o. male, who is referred here by Eden Emms, NP for the evaluation of establish care with allergist - hymenoptera allergy.  07/25/2022 PCP visit: "Allergies: Has been seen by [facility] and was getting allergy shots. Needs allergy referral.  Patient does have EpiPen if needed.  Patient is never had to use EpiPen at home.  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 male with: No problem-specific Assessment & Plan notes found for this encounter.  No follow-ups on file.  No orders of the defined types were placed in this encounter.  Lab Orders  No laboratory test(s) ordered today    Other allergy screening: Asthma: {Blank single:19197::"yes","no"} Rhino conjunctivitis: {Blank single:19197::"yes","no"} Food allergy: {Blank single:19197::"yes","no"} Medication allergy: {Blank single:19197::"yes","no"} Hymenoptera allergy: {Blank single:19197::"yes","no"} Urticaria: {Blank single:19197::"yes","no"} Eczema:{Blank single:19197::"yes","no"} History of recurrent infections suggestive of immunodeficency: {Blank single:19197::"yes","no"}  Diagnostics: Spirometry:  Tracings reviewed. His effort: {Blank single:19197::"Good reproducible efforts.","It was hard to get consistent efforts and there is a question as to whether this reflects a maximal maneuver.","Poor effort, data can not be interpreted."} FVC: ***L FEV1: ***L, ***% predicted FEV1/FVC ratio: ***% Interpretation: {Blank single:19197::"Spirometry consistent with mild obstructive disease","Spirometry  consistent with moderate obstructive disease","Spirometry consistent with severe obstructive disease","Spirometry consistent with possible restrictive disease","Spirometry consistent with mixed obstructive and restrictive disease","Spirometry uninterpretable due to technique","Spirometry consistent with normal pattern","No overt abnormalities noted given today's efforts"}.  Please see scanned spirometry results for details.  Skin Testing: {Blank single:19197::"Select foods","Environmental allergy panel","Environmental allergy panel and select foods","Food allergy panel","None","Deferred due to recent antihistamines use"}. *** Results discussed with patient/family.   Past Medical History: Patient Active Problem List   Diagnosis Date Noted   History of cervical spinal surgery 07/25/2022   Paresthesia 07/25/2022   Other insomnia 07/25/2022   Environmental allergies 07/25/2022   Obstructive apnea 12/27/2013   Headache, migraine 08/14/2013   DDD (degenerative disc disease), cervical 03/27/2013   Affective disorder (HCC) 03/27/2013   Anxiety, generalized 03/27/2013   Compulsive tobacco user syndrome 03/27/2013   Past Medical History:  Diagnosis Date   Anxiety    DDD (degenerative disc disease), cervical    History of drug abuse (HCC)    Migraine    Sleep apnea    Past Surgical History: Past Surgical History:  Procedure Laterality Date   ADENOIDECTOMY     KNEE SURGERY Left    x 2 arthroscopic   NECK SURGERY     2 disc fused and titanium cage put in, C4 and C5   TONSILLECTOMY     URETHRA SURGERY     Medication List:  Current Outpatient Medications  Medication Sig Dispense Refill   EPINEPHrine 0.3 mg/0.3 mL IJ SOAJ injection INJECT 1 PEN IN THE MUSCLE ONE TIME AS DIRECTED PER INSTRUCTIONS IN PACK     fluticasone (FLONASE) 50 MCG/ACT nasal spray SHAKE LIQUID AND USE 2 SPRAYS IN EACH NOSTRIL DAILY     hydrOXYzine (VISTARIL) 25 MG capsule Take 1 capsule (25 mg total) by mouth 2  (two) times daily as needed. 30 capsule 0   montelukast (  SINGULAIR) 10 MG tablet Take 1 tablet (10 mg total) by mouth at bedtime. 30 tablet 2   SUMAtriptan (IMITREX) 20 MG/ACT nasal spray Place 1 spray (20 mg total) into the nose every 2 (two) hours as needed for migraine or headache. May repeat in 2 hours if headache persists or recurs. 1 each 0   No current facility-administered medications for this visit.   Allergies: Allergies  Allergen Reactions   Bee Venom Anaphylaxis   Social History: Social History   Socioeconomic History   Marital status: Legally Separated    Spouse name: Not on file   Number of children: 2   Years of education: Not on file   Highest education level: Not on file  Occupational History   Not on file  Tobacco Use   Smoking status: Never   Smokeless tobacco: Current    Types: Chew, Snuff   Tobacco comments:    Dip tobacco use-1 can a day  Vaping Use   Vaping Use: Never used  Substance and Sexual Activity   Alcohol use: Yes    Comment: 3 to 5 drinks a week-on the weekend usually   Drug use: Not Currently    Types: GHB, Cocaine, Fentanyl, Heroin, MDMA (Ecstacy), Ketamine    Comment: sober since 2018   Sexual activity: Not Currently  Other Topics Concern   Not on file  Social History Narrative   Seth Booker  8 Seth Booker 16      ITG: fulltime cigarette company    Social Determinants of Radio broadcast assistant Strain: Not on file  Food Insecurity: Not on file  Transportation Needs: Not on file  Physical Activity: Not on file  Stress: Not on file  Social Connections: Not on file   Lives in a ***. Smoking: *** Occupation: ***  Environmental HistoryFreight forwarder in the house: Estate agent in the family room: {Blank single:19197::"yes","no"} Carpet in the bedroom: {Blank single:19197::"yes","no"} Heating: {Blank single:19197::"electric","gas","heat pump"} Cooling: {Blank single:19197::"central","window","heat  pump"} Pet: {Blank single:19197::"yes ***","no"}  Family History: Family History  Problem Relation Age of Onset   Breast cancer Mother    Thyroid cancer Mother    COPD Father    Emphysema Father    Juvenile Diabetes Sister    Problem                               Relation Asthma                                   *** Eczema                                *** Food allergy                          *** Allergic rhino conjunctivitis     ***  Review of Systems  Constitutional:  Negative for appetite change, chills, fever and unexpected weight change.  HENT:  Negative for congestion and rhinorrhea.   Eyes:  Negative for itching.  Respiratory:  Negative for cough, chest tightness, shortness of breath and wheezing.   Cardiovascular:  Negative for chest pain.  Gastrointestinal:  Negative for abdominal pain.  Genitourinary:  Negative for difficulty urinating.  Skin:  Negative for rash.  Neurological:  Negative  for headaches.    Objective: There were no vitals taken for this visit. There is no height or weight on file to calculate BMI. Physical Exam Vitals and nursing note reviewed.  Constitutional:      Appearance: Normal appearance. He is well-developed.  HENT:     Head: Normocephalic and atraumatic.     Right Ear: Tympanic membrane and external ear normal.     Left Ear: Tympanic membrane and external ear normal.     Nose: Nose normal.     Mouth/Throat:     Mouth: Mucous membranes are moist.     Pharynx: Oropharynx is clear.  Eyes:     Conjunctiva/sclera: Conjunctivae normal.  Cardiovascular:     Rate and Rhythm: Normal rate and regular rhythm.     Heart sounds: Normal heart sounds. No murmur heard.    No friction rub. No gallop.  Pulmonary:     Effort: Pulmonary effort is normal.     Breath sounds: Normal breath sounds. No wheezing, rhonchi or rales.  Musculoskeletal:     Cervical back: Neck supple.  Skin:    General: Skin is warm.     Findings: No rash.   Neurological:     Mental Status: He is alert and oriented to person, place, and time.  Psychiatric:        Behavior: Behavior normal.    The plan was reviewed with the patient/family, and all questions/concerned were addressed.  It was my pleasure to see Seth Booker today and participate in his care. Please feel free to contact me with any questions or concerns.  Sincerely,  Wyline Mood, DO Allergy & Immunology  [facility] office: [phone] [facility] office: [phone]

## 2022-08-14 ENCOUNTER — Ambulatory Visit (INDEPENDENT_AMBULATORY_CARE_PROVIDER_SITE_OTHER): Payer: 59 | Admitting: Allergy

## 2022-08-14 ENCOUNTER — Encounter: Payer: Self-pay | Admitting: Allergy

## 2022-08-14 VITALS — BP 114/76 | HR 89 | Temp 98.7°F | Resp 18 | Ht 74.0 in | Wt 255.0 lb

## 2022-08-14 DIAGNOSIS — J3089 Other allergic rhinitis: Secondary | ICD-10-CM | POA: Diagnosis not present

## 2022-08-14 DIAGNOSIS — H1013 Acute atopic conjunctivitis, bilateral: Secondary | ICD-10-CM | POA: Diagnosis not present

## 2022-08-14 DIAGNOSIS — Z91038 Other insect allergy status: Secondary | ICD-10-CM | POA: Diagnosis not present

## 2022-08-14 NOTE — Patient Instructions (Addendum)
I am requesting records from your prior allergist.  If you do not hear back from [place] in about a week please give our office a call.  Based on testing results will decide whether we need to repeat testing or if I can make allergy shots based on those results.  Environmental allergies Start environmental control measures as below. Use over the counter antihistamines such as Zyrtec (cetirizine), Claritin (loratadine), Allegra (fexofenadine), or Xyzal (levocetirizine) daily as needed. May take twice a day during allergy flares. May switch antihistamines every few months. Continue Singulair (montelukast) 10mg  daily at night. Use Flonase (fluticasone) nasal spray 1 spray per nostril twice a day as needed for nasal congestion.  Nasal saline spray (i.e., Simply Saline) or nasal saline lavage (i.e., NeilMed) is recommended as needed and prior to medicated nasal sprays.  Start allergy injections. Had a detailed discussion with patient/family that clinical history is suggestive of allergic rhinitis, and may benefit from allergy immunotherapy (AIT). Discussed in detail regarding the dosing, schedule, side effects (mild to moderate local allergic reaction and rarely systemic allergic reactions including anaphylaxis), and benefits (significant improvement in nasal symptoms, seasonal flares of asthma) of immunotherapy with the patient. There is significant time commitment involved with allergy shots, which includes weekly immunotherapy injections for first 9-12 months and then biweekly to monthly injections for 3-5 years. Consent was signed. I have prescribed epinephrine injectable and demonstrated proper use. For mild symptoms you can take over the counter antihistamines such as Benadryl and monitor symptoms closely. If symptoms worsen or if you have severe symptoms including breathing issues, throat closure, significant swelling, whole body hives, severe diarrhea and vomiting, lightheadedness then inject epinephrine  and seek immediate medical care afterwards. Emergency action plan given.  Bee sting allergies Continue to avoid. For mild symptoms you can take over the counter antihistamines such as Benadryl and monitor symptoms closely. If symptoms worsen or if you have severe symptoms including breathing issues, throat closure, significant swelling, whole body hives, severe diarrhea and vomiting, lightheadedness then inject epinephrine and seek immediate medical care afterwards. Will review bloodwork results - if significantly positive we do have injections for this as well.   Follow up in 6 months or sooner if needed.    Reducing Pollen Exposure Pollen seasons: trees (spring), grass (summer) and ragweed/weeds (fall). Keep windows closed in your home and car to lower pollen exposure.  Install air conditioning in the bedroom and throughout the house if possible.  Avoid going out in dry windy days - especially early morning. Pollen counts are highest between 5 - 10 AM and on dry, hot and windy days.  Save outside activities for late afternoon or after a heavy rain, when pollen levels are lower.  Avoid mowing of grass if you have grass pollen allergy. Be aware that pollen can also be transported indoors on people and pets.  Dry your clothes in an automatic dryer rather than hanging them outside where they might collect pollen.  Rinse hair and eyes before bedtime. Pet Allergen Avoidance: Contrary to popular opinion, there are no "hypoallergenic" breeds of dogs or cats. That is because people are not allergic to an animal's hair, but to an allergen found in the animal's saliva, dander (dead skin flakes) or urine. Pet allergy symptoms typically occur within minutes. For some people, symptoms can build up and become most severe 8 to 12 hours after contact with the animal. People with severe allergies can experience reactions in public places if dander has been transported on  the pet owners' clothing. Keeping an  animal outdoors is only a partial solution, since homes with pets in the yard still have higher concentrations of animal allergens. Before getting a pet, ask your allergist to determine if you are allergic to animals. If your pet is already considered part of your family, try to minimize contact and keep the pet out of the bedroom and other rooms where you spend a great deal of time. As with dust mites, vacuum carpets often or replace carpet with a hardwood floor, tile or linoleum. High-efficiency particulate air (HEPA) cleaners can reduce allergen levels over time. While dander and saliva are the source of cat and dog allergens, urine is the source of allergens from rabbits, hamsters, mice and [place] pigs; so ask a non-allergic family member to clean the animal's cage. If you have a pet allergy, talk to your allergist about the potential for allergy immunotherapy (allergy shots). This strategy can often provide long-term relief. Control of House Dust Mite Allergen Dust mite allergens are a common trigger of allergy and asthma symptoms. While they can be found throughout the house, these microscopic creatures thrive in warm, humid environments such as bedding, upholstered furniture and carpeting. Because so much time is spent in the bedroom, it is essential to reduce mite levels there.  Encase pillows, mattresses, and box springs in special allergen-proof fabric covers or airtight, zippered plastic covers.  Bedding should be washed weekly in hot water (130 F) and dried in a hot dryer. Allergen-proof covers are available for comforters and pillows that can't be regularly washed.  Wash the allergy-proof covers every few months. Minimize clutter in the bedroom. Keep pets out of the bedroom.  Keep humidity less than 50% by using a dehumidifier or air conditioning. You can buy a humidity measuring device called a hygrometer to monitor this.  If possible, replace carpets with hardwood, linoleum, or washable  area rugs. If that's not possible, vacuum frequently with a vacuum that has a HEPA filter. Remove all upholstered furniture and non-washable window drapes from the bedroom. Remove all non-washable stuffed toys from the bedroom.  Wash stuffed toys weekly.

## 2022-08-14 NOTE — Assessment & Plan Note (Signed)
.   See assessment and plan as above. 

## 2022-08-14 NOTE — Assessment & Plan Note (Signed)
Large localized reaction and breathing issues in the past.  No stings in the last 5 years.  Currently has EpiPen but never had to use it.  Apparently had blood work done in the past for this. . Requesting records. . Continue to avoid. . For mild symptoms you can take over the counter antihistamines such as Benadryl and monitor symptoms closely. If symptoms worsen or if you have severe symptoms including breathing issues, throat closure, significant swelling, whole body hives, severe diarrhea and vomiting, lightheadedness then inject epinephrine and seek immediate medical care afterwards. . Will review bloodwork results - if significantly positive then recommend starting venom immunotherapy.

## 2022-08-14 NOTE — Assessment & Plan Note (Signed)
Perennial rhinoconjunctivitis symptoms which flares around dogs and in the spring and fall.  Tried Singulair Flonase with good benefit.  Skin testing at outside allergist showed multiple positives and was on AIT for 4 months and wants to transfer care to our office. Last injection 6 weeks ago. Sinus surgery in the past.  Requesting records from prior allergist.  Based on testing results will decide whether we need to repeat testing or if I can write script based on those results.   Start environmental control measures as below.  Use over the counter antihistamines such as Zyrtec (cetirizine), Claritin (loratadine), Allegra (fexofenadine), or Xyzal (levocetirizine) daily as needed. May take twice a day during allergy flares. May switch antihistamines every few months.  Continue Singulair (montelukast) 10mg  daily at night.  Use Flonase (fluticasone) nasal spray 1 spray per nostril twice a day as needed for nasal congestion.   Nasal saline spray (i.e., Simply Saline) or nasal saline lavage (i.e., NeilMed) is recommended as needed and prior to medicated nasal sprays.  Start allergy injections - will schedule first visit after records reviewed.  Had a detailed discussion with patient/family that clinical history is suggestive of allergic rhinitis, and may benefit from allergy immunotherapy (AIT). Discussed in detail regarding the dosing, schedule, side effects (mild to moderate local allergic reaction and rarely systemic allergic reactions including anaphylaxis), and benefits (significant improvement in nasal symptoms, seasonal flares of asthma) of immunotherapy with the patient. There is significant time commitment involved with allergy shots, which includes weekly immunotherapy injections for first 9-12 months and then biweekly to monthly injections for 3-5 years. Consent was signed.  I have prescribed epinephrine injectable and demonstrated proper use. For mild symptoms you can take over the counter  antihistamines such as Benadryl and monitor symptoms closely. If symptoms worsen or if you have severe symptoms including breathing issues, throat closure, significant swelling, whole body hives, severe diarrhea and vomiting, lightheadedness then inject epinephrine and seek immediate medical care afterwards. Emergency action plan given.

## 2022-08-19 ENCOUNTER — Other Ambulatory Visit: Payer: Self-pay | Admitting: Neurosurgery

## 2022-08-19 DIAGNOSIS — M5412 Radiculopathy, cervical region: Secondary | ICD-10-CM

## 2022-08-22 ENCOUNTER — Telehealth: Payer: Self-pay | Admitting: Allergy

## 2022-08-22 DIAGNOSIS — J3089 Other allergic rhinitis: Secondary | ICD-10-CM

## 2022-08-22 NOTE — Treatment Plan (Signed)
VIALS NOT MADE UNTIL APPT IS SCHED 

## 2022-08-22 NOTE — Treatment Plan (Signed)
Aeroallergen Immunotherapy  Ordering Provider: Dr. Rexene Alberts  Patient Details Name: Seth Booker MRN: [id number] Date of Birth: Feb 02, 1976  Order 1 of 2  Vial Label: [id number]  0.3 ml (Volume)  BAU Concentration -- 7 Grass Mix* 100,000 ([address] [place], [place], [place], Perennial Rye, RedTop, Sweet Vernal, Timothy) 0.3 ml (Volume)  1:20 Concentration -- Ragweed Mix 0.5 ml (Volume)  1:20 Concentration -- Weed Mix* 0.5 ml (Volume)  1:20 Concentration -- Eastern 10 Tree Mix (also Sweet Gum) 0.2 ml (Volume)  1:20 Concentration -- Box Elder 0.2 ml (Volume)  1:10 Concentration -- Cedar, red 0.2 ml (Volume)  1:10 Concentration -- Pine Mix 0.2 ml (Volume)  1:20 Concentration -- Walnut, Black Pollen 0.5 ml (Volume)   AU Concentration -- Mite Mix (DF 5,000 & DP 5,000)   2.9  ml Extract Subtotal 2.1  ml Diluent 5.0  ml Maintenance Total  Schedule:  B Blue Vial (1:100,000): Schedule B (6 doses) Yellow Vial (1:10,000): Schedule B (6 doses) Green Vial (1:1,000): Schedule B (6 doses) Red Vial (1:100): Schedule A (14 doses)  Special Instructions: once per week.

## 2022-08-22 NOTE — Telephone Encounter (Signed)
Called and left a voicemail asking for patient to return call to discuss.  °

## 2022-08-22 NOTE — Treatment Plan (Signed)
Aeroallergen Immunotherapy  Ordering Provider: Dr. Rexene Alberts  Patient Details Name: Seth Booker MRN: [id number] Date of Birth: [birth year]  Order 2 of 2  Vial Label: M-[id number].  0.2 ml (Volume)  1:20 Concentration -- Alternaria alternata 0.2 ml (Volume)  1:20 Concentration -- Cladosporium herbarum 0.2 ml (Volume)  1:10 Concentration -- Aspergillus mix 0.2 ml (Volume)  1:10 Concentration -- Penicillium mix 0.2 ml (Volume)  1:10 Concentration -- Mucor plumbeus 0.2 ml (Volume)  1:40 Concentration -- Aureobasidium pullulans 0.2 ml (Volume)  1:40 Concentration -- Epicoccum nigrum 0.5 ml (Volume)  1:10 Concentration -- Cat Hair 0.3 ml (Volume)  1:20 Concentration -- Cockroach, German 0.5 ml (Volume)  1:10 Concentration -- Dog Epithelia  2.7  ml Extract Subtotal 2.3  ml Diluent 5.0  ml Maintenance Total  Schedule:  B Blue Vial (1:100,000): Schedule B (6 doses) Yellow Vial (1:10,000): Schedule B (6 doses) Green Vial (1:1,000): Schedule B (6 doses) Red Vial (1:100): Schedule A (14 doses)  Special Instructions: once per week.

## 2022-08-22 NOTE — Telephone Encounter (Signed)
Please call patient.  Reviewed records from [facility] and I can write a prescription for the allergy shots using the test results from them.  If still interested in starting AIT then please schedule for first injection visit. It will be 2 injections each time.   Patient already signed consent forms.  Records reviewed form [facility]. 2023 bloodwork positive to dust mites, negative hymenoptera panel.  2023 skin testing positive to grass, weed, ragweed, trees, mold, dust mites, cat, dog, feathers, horse, mouse, cockroach.

## 2022-08-23 ENCOUNTER — Ambulatory Visit: Payer: 59 | Admitting: Nurse Practitioner

## 2022-08-23 NOTE — Telephone Encounter (Signed)
Called and left a voicemail asking for patient to return call to discuss.  °

## 2022-08-26 ENCOUNTER — Ambulatory Visit
Admission: RE | Admit: 2022-08-26 | Discharge: 2022-08-26 | Disposition: A | Discharge status: Home / Self Care | Payer: 59 | Source: Ambulatory Visit | Attending: Neurosurgery | Admitting: Neurosurgery

## 2022-08-26 DIAGNOSIS — M5412 Radiculopathy, cervical region: Secondary | ICD-10-CM

## 2022-08-27 ENCOUNTER — Encounter: Payer: Self-pay | Admitting: *Deleted

## 2022-08-27 NOTE — Telephone Encounter (Signed)
MyChart message has been sent to patient in regards to starting allergy injections.

## 2023-02-11 DIAGNOSIS — H1010 Acute atopic conjunctivitis, unspecified eye: Secondary | ICD-10-CM | POA: Insufficient documentation

## 2023-02-11 NOTE — Progress Notes (Deleted)
Follow Up Note  RE: [name] MRN: [id number] DOB: [birth year] Date of Office Visit: 02/12/2023  Referring provider: Eden Emms, NP Primary care provider: Eden Emms, NP  Chief Complaint: No chief complaint on file.  History of Present Illness: I had the pleasure of seeing Seth Booker for a follow up visit at the [facility] on 02/11/2023. He is a 47 y.o. male, who is being followed for allergic rhinoconjunctivitis and hymenoptera allergy. His previous allergy office visit was on 08/14/2022 with Dr. Selena Batten. Today is a regular follow up visit.  2023 bloodwork positive to dust mites, negative hymenoptera panel.  2023 skin testing positive to grass, weed, ragweed, trees, mold, dust mites, cat, dog, feathers, horse, mouse, cockroach.     Other allergic rhinitis Perennial rhinoconjunctivitis symptoms which flares around dogs and in the spring and fall.  Tried Singulair Flonase with good benefit.  Skin testing at outside allergist showed multiple positives and was on AIT for 4 months and wants to transfer care to our office. Last injection 6 weeks ago. Sinus surgery in the past. Requesting records from prior allergist.  Based on testing results will decide whether we need to repeat testing or if I can write script based on those results.  Start environmental control measures as below. Use over the counter antihistamines such as Zyrtec (cetirizine), Claritin (loratadine), Allegra (fexofenadine), or Xyzal (levocetirizine) daily as needed. May take twice a day during allergy flares. May switch antihistamines every few months. Continue Singulair (montelukast) 10mg  daily at night. Use Flonase (fluticasone) nasal spray 1 spray per nostril twice a day as needed for nasal congestion.  Nasal saline spray (i.e., Simply Saline) or nasal saline lavage (i.e., NeilMed) is recommended as needed and prior to medicated nasal sprays. Start allergy injections - will schedule first visit  after records reviewed. Had a detailed discussion with patient/family that clinical history is suggestive of allergic rhinitis, and may benefit from allergy immunotherapy (AIT). Discussed in detail regarding the dosing, schedule, side effects (mild to moderate local allergic reaction and rarely systemic allergic reactions including anaphylaxis), and benefits (significant improvement in nasal symptoms, seasonal flares of asthma) of immunotherapy with the patient. There is significant time commitment involved with allergy shots, which includes weekly immunotherapy injections for first 9-12 months and then biweekly to monthly injections for 3-5 years. Consent was signed. I have prescribed epinephrine injectable and demonstrated proper use. For mild symptoms you can take over the counter antihistamines such as Benadryl and monitor symptoms closely. If symptoms worsen or if you have severe symptoms including breathing issues, throat closure, significant swelling, whole body hives, severe diarrhea and vomiting, lightheadedness then inject epinephrine and seek immediate medical care afterwards. Emergency action plan given.   Allergic conjunctivitis of both eyes See assessment and plan as above.   Hymenoptera allergy Large localized reaction and breathing issues in the past.  No stings in the last 5 years.  Currently has EpiPen but never had to use it.  Apparently had blood work done in the past for this. Requesting records. Continue to avoid. For mild symptoms you can take over the counter antihistamines such as Benadryl and monitor symptoms closely. If symptoms worsen or if you have severe symptoms including breathing issues, throat closure, significant swelling, whole body hives, severe diarrhea and vomiting, lightheadedness then inject epinephrine and seek immediate medical care afterwards. Will review bloodwork results - if significantly positive then recommend starting venom immunotherapy.   Return in  about  6 months (around 02/13/2023).  Assessment and Plan: Seth Booker is a 47 y.o. male with: No problem-specific Assessment & Plan notes found for this encounter.  No follow-ups on file.  No orders of the defined types were placed in this encounter.  Lab Orders  No laboratory test(s) ordered today    Diagnostics: Spirometry:  Tracings reviewed. His effort: {Blank single:19197::"Good reproducible efforts.","It was hard to get consistent efforts and there is a question as to whether this reflects a maximal maneuver.","Poor effort, data can not be interpreted."} FVC: ***L FEV1: ***L, ***% predicted FEV1/FVC ratio: ***% Interpretation: {Blank single:19197::"Spirometry consistent with mild obstructive disease","Spirometry consistent with moderate obstructive disease","Spirometry consistent with severe obstructive disease","Spirometry consistent with possible restrictive disease","Spirometry consistent with mixed obstructive and restrictive disease","Spirometry uninterpretable due to technique","Spirometry consistent with normal pattern","No overt abnormalities noted given today's efforts"}.  Please see scanned spirometry results for details.  Skin Testing: {Blank single:19197::"Select foods","Environmental allergy panel","Environmental allergy panel and select foods","Food allergy panel","None","Deferred due to recent antihistamines use"}. *** Results discussed with patient/family.   Medication List:  Current Outpatient Medications  Medication Sig Dispense Refill   EPINEPHrine 0.3 mg/0.3 mL IJ SOAJ injection INJECT 1 PEN IN THE MUSCLE ONE TIME AS DIRECTED PER INSTRUCTIONS IN PACK     fluticasone (FLONASE) 50 MCG/ACT nasal spray SHAKE LIQUID AND USE 2 SPRAYS IN EACH NOSTRIL DAILY     hydrOXYzine (VISTARIL) 25 MG capsule Take 1 capsule (25 mg total) by mouth 2 (two) times daily as needed. 30 capsule 0   montelukast (SINGULAIR) 10 MG tablet Take 1 tablet (10 mg total) by mouth at bedtime. 30  tablet 2   SUMAtriptan (IMITREX) 20 MG/ACT nasal spray Place 1 spray (20 mg total) into the nose every 2 (two) hours as needed for migraine or headache. May repeat in 2 hours if headache persists or recurs. 1 each 0   No current facility-administered medications for this visit.   Allergies: Allergies  Allergen Reactions   Bee Venom Anaphylaxis   I reviewed his past medical history, social history, family history, and environmental history and no significant changes have been reported from his previous visit.  Review of Systems  Constitutional:  Negative for appetite change, chills, fever and unexpected weight change.  HENT:  Positive for congestion, postnasal drip, rhinorrhea and sneezing.   Eyes:  Positive for itching.  Respiratory:  Positive for cough. Negative for chest tightness, shortness of breath and wheezing.   Cardiovascular:  Negative for chest pain.  Gastrointestinal:  Negative for abdominal pain.  Genitourinary:  Negative for difficulty urinating.  Skin:  Negative for rash.  Allergic/Immunologic: Positive for environmental allergies. Negative for food allergies.  Neurological:  Positive for headaches.    Objective: There were no vitals taken for this visit. There is no height or weight on file to calculate BMI. Physical Exam Vitals and nursing note reviewed.  Constitutional:      Appearance: Normal appearance. He is well-developed.  HENT:     Head: Normocephalic and atraumatic.     Right Ear: Tympanic membrane and external ear normal.     Left Ear: Tympanic membrane and external ear normal.     Nose: Nose normal.     Mouth/Throat:     Mouth: Mucous membranes are moist.     Pharynx: Oropharynx is clear.  Eyes:     Conjunctiva/sclera: Conjunctivae normal.  Cardiovascular:     Rate and Rhythm: Normal rate and regular rhythm.     Heart sounds: Normal heart sounds. No murmur  heard.    No friction rub. No gallop.  Pulmonary:     Effort: Pulmonary effort is normal.      Breath sounds: Normal breath sounds. No wheezing, rhonchi or rales.  Musculoskeletal:     Cervical back: Neck supple.  Skin:    General: Skin is warm.     Findings: No rash.  Neurological:     Mental Status: He is alert and oriented to person, place, and time.  Psychiatric:        Behavior: Behavior normal.    Previous notes and tests were reviewed. The plan was reviewed with the patient/family, and all questions/concerned were addressed.  It was my pleasure to see Etan today and participate in his care. Please feel free to contact me with any questions or concerns.  Sincerely,  Wyline Mood, DO Allergy & Immunology  [facility] office: [phone] [facility] office: [phone]

## 2023-02-12 ENCOUNTER — Ambulatory Visit: Payer: 59 | Admitting: Allergy

## 2023-02-12 DIAGNOSIS — H1010 Acute atopic conjunctivitis, unspecified eye: Secondary | ICD-10-CM

## 2023-02-12 DIAGNOSIS — J309 Allergic rhinitis, unspecified: Secondary | ICD-10-CM

## 2023-03-04 ENCOUNTER — Other Ambulatory Visit: Payer: Self-pay

## 2023-03-04 ENCOUNTER — Emergency Department (HOSPITAL_BASED_OUTPATIENT_CLINIC_OR_DEPARTMENT_OTHER)
Admission: EM | Admit: 2023-03-04 | Discharge: 2023-03-04 | Disposition: A | Discharge status: Home / Self Care | Payer: 59 | Attending: Emergency Medicine | Admitting: Emergency Medicine

## 2023-03-04 DIAGNOSIS — X58XXXA Exposure to other specified factors, initial encounter: Secondary | ICD-10-CM | POA: Diagnosis not present

## 2023-03-04 DIAGNOSIS — T18128A Food in esophagus causing other injury, initial encounter: Secondary | ICD-10-CM

## 2023-03-04 DIAGNOSIS — K222 Esophageal obstruction: Secondary | ICD-10-CM | POA: Insufficient documentation

## 2023-03-04 MED ORDER — GLUCAGON HCL RDNA (DIAGNOSTIC) 1 MG IJ SOLR
1.0000 mg | Freq: Once | INTRAMUSCULAR | Status: AC
  Administered 2023-03-04: 1 mg via INTRAVENOUS
  Filled 2023-03-04: qty 1

## 2023-03-04 NOTE — ED Triage Notes (Signed)
Patient presents to ED via POV from home. Patient was eating fried chicken when it got stuck in his throat. Even and non labored respirations noted. Able to speak full, complete sentences.

## 2023-03-04 NOTE — ED Notes (Signed)
Patient transported to CT 

## 2023-03-04 NOTE — ED Notes (Signed)
Pt A&OX4 ambulatory at d/c with independent steady gait. Pt verbalized understanding of d/c instructions and follow up care. 

## 2023-03-04 NOTE — ED Provider Notes (Signed)
[facility] Provider Note   CSN: [id number] Arrival date & time: 03/04/23  1813     History  Chief Complaint  Patient presents with   Food Bolus    Seth Booker is a 47 y.o. male.  Patient is a 47 year old male who presents with difficulty swallowing.  He was eating some fried chicken and feels like it got stuck in his throat.  He does not have a cough reflex.  He feels a little bit short of breath when he is walking around but does not have a sensation to cough.  He says that every time he tries to swallow something including his saliva that it comes right back up.  He denies any abdominal pain.  He denies any history of similar symptoms in the past.  No history of any GI issues.  No history of significant reflux.  He has never seen a gastroenterologist in the past.       Home Medications Prior to Admission medications   Medication Sig Start Date End Date Taking? Authorizing Provider  EPINEPHrine 0.3 mg/0.3 mL IJ SOAJ injection INJECT 1 PEN IN THE MUSCLE ONE TIME AS DIRECTED PER INSTRUCTIONS IN PACK 11/22/21   [provider]  fluticasone (FLONASE) 50 MCG/ACT nasal spray SHAKE LIQUID AND USE 2 SPRAYS IN EACH NOSTRIL DAILY 11/21/21   [provider]  hydrOXYzine (VISTARIL) 25 MG capsule Take 1 capsule (25 mg total) by mouth 2 (two) times daily as needed. 07/25/22   Eden Emms, NP  montelukast (SINGULAIR) 10 MG tablet Take 1 tablet (10 mg total) by mouth at bedtime. 08/07/22   Eden Emms, NP  SUMAtriptan (IMITREX) 20 MG/ACT nasal spray Place 1 spray (20 mg total) into the nose every 2 (two) hours as needed for migraine or headache. May repeat in 2 hours if headache persists or recurs. 09/20/21   Valinda Hoar, NP      Allergies    Bee venom    Review of Systems   Review of Systems  Constitutional:  Negative for chills, diaphoresis, fatigue and fever.  HENT:  Positive for trouble swallowing. Negative for  congestion, rhinorrhea and sneezing.   Eyes: Negative.   Respiratory:  Positive for shortness of breath. Negative for cough and chest tightness.   Cardiovascular:  Negative for chest pain and leg swelling.  Gastrointestinal:  Positive for vomiting. Negative for abdominal pain, blood in stool, diarrhea and nausea.  Genitourinary:  Negative for difficulty urinating, flank pain, frequency and hematuria.  Musculoskeletal:  Negative for arthralgias and back pain.  Skin:  Negative for rash.  Neurological:  Negative for dizziness, speech difficulty, weakness, numbness and headaches.    Physical Exam Updated Vital Signs BP (!) 144/89   Pulse 81   Temp 97.8 F (36.6 C)   Resp 18   Ht 6\' 2"  (1.88 m)   Wt 127 kg   SpO2 94%   BMI 35.95 kg/m  Physical Exam Constitutional:      Appearance: He is well-developed.  HENT:     Head: Normocephalic and atraumatic.  Eyes:     Pupils: Pupils are equal, round, and reactive to light.  Cardiovascular:     Rate and Rhythm: Normal rate and regular rhythm.     Heart sounds: Normal heart sounds.  Pulmonary:     Effort: Pulmonary effort is normal. No respiratory distress.     Breath sounds: Normal breath sounds. No wheezing or rales.  Chest:  Chest wall: No tenderness.  Abdominal:     General: Bowel sounds are normal.     Palpations: Abdomen is soft.     Tenderness: There is no abdominal tenderness. There is no guarding or rebound.  Musculoskeletal:        General: Normal range of motion.     Cervical back: Normal range of motion and neck supple.  Lymphadenopathy:     Cervical: No cervical adenopathy.  Skin:    General: Skin is warm and dry.     Findings: No rash.  Neurological:     Mental Status: He is alert and oriented to person, place, and time.     ED Results / Procedures / Treatments   Labs (all labs ordered are listed, but only abnormal results are displayed) Labs Reviewed - No data to display  EKG None  Radiology No  results found.  Procedures Procedures    Medications Ordered in ED Medications  glucagon (human recombinant) (GLUCAGEN) injection 1 mg (1 mg Intravenous Given 03/04/23 2133)    ED Course/ Medical Decision Making/ A&P                             Medical Decision Making Risk Prescription drug management.   Patient is a 47 year old male who presents with an esophageal food impaction.  He is not able to swallow down anything even his spit without it coming back up.  He does not have any cough reflex or other suggestions of lung foreign body.  We initially tried some carbonated beverage which was not effective.  He was given some glucagon and following this, it did seem to resolve the food impaction.  He said it feels like it went down and now he is able to drink without any symptoms.  This point he is asymptomatic.  He was discharged home in good condition.  Advised him that he needs to follow-up with a gastroenterologist to have a likely endoscopy.  He was given a referral for this.  He was advised to eat small bites and chew his food really well.  Return precautions were given.  Final Clinical Impression(s) / ED Diagnoses Final diagnoses:  Esophageal obstruction due to food impaction    Rx / DC Orders ED Discharge Orders     None         Rolan Bucco, MD 03/04/23 2227

## 2023-03-05 ENCOUNTER — Telehealth: Payer: Self-pay

## 2023-03-05 NOTE — Transitions of Care (Post Inpatient/ED Visit) (Signed)
   03/07/2023  Name: Seth Booker MRN: [id number] DOB: [birth year]  Today's TOC FU Call Status: Today's TOC FU Call Status:: Unsuccessful Call (3rd Attempt) Unsuccessful Call (2nd Attempt) Date: 03/06/23 Unsuccessful Call (3rd Attempt) Date: 03/07/23  Attempted to reach the patient regarding the most recent Inpatient/ED visit.  Follow Up Plan: No further outreach attempts will be made at this time. We have been unable to contact the patient.  Signature  Arul Farabee,CMA [facility] ,[facility]

## 2023-03-07 NOTE — Telephone Encounter (Signed)
noted 

## 2023-08-21 ENCOUNTER — Ambulatory Visit (HOSPITAL_COMMUNITY)
Admission: EM | Admit: 2023-08-21 | Discharge: 2023-08-21 | Disposition: A | Discharge status: Home / Self Care | Payer: 59 | Attending: Internal Medicine | Admitting: Internal Medicine

## 2023-08-21 ENCOUNTER — Encounter (HOSPITAL_COMMUNITY): Payer: Self-pay

## 2023-08-21 DIAGNOSIS — M25421 Effusion, right elbow: Secondary | ICD-10-CM | POA: Diagnosis not present

## 2023-08-21 DIAGNOSIS — M7021 Olecranon bursitis, right elbow: Secondary | ICD-10-CM | POA: Diagnosis not present

## 2023-08-21 MED ORDER — POVIDONE-IODINE 10 % EX SOLN
CUTANEOUS | Status: AC
  Filled 2023-08-21: qty 118

## 2023-08-21 MED ORDER — DOXYCYCLINE HYCLATE 100 MG PO CAPS: 100.0000 mg | ORAL_CAPSULE | Freq: Two times a day (BID) | ORAL | 0 refills | Status: AC

## 2023-08-21 NOTE — ED Triage Notes (Addendum)
Pt presents with right elbow injury. On assessment, pt's right elbow is red and very swollen. Pt states "about two weeks ago, a metal door slung into my elbow, it started to hurt and began to swell three days later. So, I cut it open on the 10/14 with a scalpel I rinsed with alcohol, blood and pus came out, but it still looks the same." Tylenol, Ibuprofen, Aleve taken for pain. Last dose of Aleve at 0400 this AM, "not really any relief." Pt also reports he "took old antibiotics after making an incision in wound" on right elbow. Pt states he can move his elbow in all directions.

## 2023-08-21 NOTE — ED Provider Notes (Signed)
[facility]    CSN: [id number] Arrival date & time: 08/21/23  1225      History   Chief Complaint Chief Complaint  Patient presents with   Arm Injury    HPI Seth Booker is a 47 y.o. male.   Patient presents to urgent care for evaluation of pain and swelling to the right elbow that started 2 weeks ago.  He injured his right elbow on a door at a bar and started to notice pain and swelling the next morning.  Swelling became so significant and tender that he decided to clean one of his taxidermy scalpels that he has at home and puncture the area of swelling.  He was able to drain a significant amount of blood and pus from the right elbow after doing this.  States pain and swelling improved significantly after incision and drainage at home. He had 18 leftover amoxicillin tablets at home from previous infections so he decided to take these in attempt to improve infected elbow.  Initially, he took 1 pill every 4 hours, then he started taking 2 pills/day until he ran out of tablets.  Elbow has become swollen once more and he states it hurts significantly when he pushes on the elbow.  Denies fevers, chills, nausea, vomiting, and bodyaches.  No history of immunosuppression.  No paresthesias distally to injury.   Arm Injury   Past Medical History:  Diagnosis Date   Anxiety    DDD (degenerative disc disease), cervical    History of drug abuse (HCC)    Migraine    Sleep apnea     Patient Active Problem List   Diagnosis Date Noted   Seasonal and perennial allergic rhinoconjunctivitis 02/11/2023   Other allergic rhinitis 08/14/2022   Allergic conjunctivitis of both eyes 08/14/2022   Hymenoptera allergy 08/14/2022   History of cervical spinal surgery 07/25/2022   Paresthesia 07/25/2022   Other insomnia 07/25/2022   Environmental allergies 07/25/2022   Obstructive apnea 12/27/2013   Headache, migraine 08/14/2013   DDD (degenerative disc disease), cervical 03/27/2013    Affective disorder (HCC) 03/27/2013   Anxiety, generalized 03/27/2013   Compulsive tobacco user syndrome 03/27/2013    Past Surgical History:  Procedure Laterality Date   ADENOIDECTOMY     KNEE SURGERY Left    x 2 arthroscopic   NECK SURGERY     2 disc fused and titanium cage put in, C4 and C5   TONSILLECTOMY     URETHRA SURGERY         Home Medications    Prior to Admission medications   Medication Sig Start Date End Date Taking? Authorizing Provider  doxycycline (VIBRAMYCIN) 100 MG capsule Take 1 capsule (100 mg total) by mouth 2 (two) times daily for 7 days. 08/21/23 08/28/23 Yes Kamiryn Bezanson, Donavan Burnet, FNP  EPINEPHrine 0.3 mg/0.3 mL IJ SOAJ injection INJECT 1 PEN IN THE MUSCLE ONE TIME AS DIRECTED PER INSTRUCTIONS IN PACK 11/22/21   [provider]  fluticasone (FLONASE) 50 MCG/ACT nasal spray SHAKE LIQUID AND USE 2 SPRAYS IN EACH NOSTRIL DAILY 11/21/21   [provider]  hydrOXYzine (VISTARIL) 25 MG capsule Take 1 capsule (25 mg total) by mouth 2 (two) times daily as needed. 07/25/22   Eden Emms, NP  montelukast (SINGULAIR) 10 MG tablet Take 1 tablet (10 mg total) by mouth at bedtime. 08/07/22   Eden Emms, NP  SUMAtriptan (IMITREX) 20 MG/ACT nasal spray Place 1 spray (20 mg total) into the nose  every 2 (two) hours as needed for migraine or headache. May repeat in 2 hours if headache persists or recurs. 09/20/21   Valinda Hoar, NP    Family History Family History  Problem Relation Age of Onset   Breast cancer Mother    Thyroid cancer Mother    COPD Father    Emphysema Father    Juvenile Diabetes Sister     Social History Social History   Tobacco Use   Smoking status: Never   Smokeless tobacco: Current    Types: Chew, Snuff   Tobacco comments:    Dip tobacco use-1 can a day  Vaping Use   Vaping status: Never Used  Substance Use Topics   Alcohol use: Yes    Comment: 3 to 5 drinks a week-on the weekend usually   Drug use: Not Currently     Types: GHB, Cocaine, Fentanyl, Heroin, MDMA (Ecstacy), Ketamine    Comment: sober since 2018     Allergies   Bee venom   Review of Systems Review of Systems Per HPI  Physical Exam Triage Vital Signs ED Triage Vitals  Encounter Vitals Group     BP 08/21/23 1402 138/82     Systolic BP Percentile --      Diastolic BP Percentile --      Pulse Rate 08/21/23 1402 (!) 107     Resp 08/21/23 1402 18     Temp 08/21/23 1402 98.3 F (36.8 C)     Temp Source 08/21/23 1402 Oral     SpO2 08/21/23 1402 96 %     Weight --      Height --      Head Circumference --      Peak Flow --      Pain Score 08/21/23 1403 4     Pain Loc --      Pain Education --      Exclude from Growth Chart --    No data found.  Updated Vital Signs BP 138/82 (BP Location: Right Arm)   Pulse (!) 107   Temp 98.3 F (36.8 C) (Oral)   Resp 18   SpO2 96%   Visual Acuity Right Eye Distance:   Left Eye Distance:   Bilateral Distance:    Right Eye Near:   Left Eye Near:    Bilateral Near:     Physical Exam Vitals and nursing note reviewed.  Constitutional:      Appearance: He is not ill-appearing or toxic-appearing.  HENT:     Head: Normocephalic and atraumatic.     Right Ear: Hearing and external ear normal.     Left Ear: Hearing and external ear normal.     Nose: Nose normal.     Mouth/Throat:     Lips: Pink.  Eyes:     General: Lids are normal. Vision grossly intact. Gaze aligned appropriately.     Extraocular Movements: Extraocular movements intact.     Conjunctiva/sclera: Conjunctivae normal.  Pulmonary:     Effort: Pulmonary effort is normal.  Musculoskeletal:     Right elbow: Swelling present. No deformity, effusion or lacerations. Normal range of motion. No tenderness.     Left elbow: Normal.     Cervical back: Neck supple.     Comments: Swelling to the olecranon process. Fluctuant, erythematous and warm to palpation, non-tender. +2 right radial pulse. 5/5 grip strength to  bilateral upper extremities.  Skin:    General: Skin is warm and dry.  Capillary Refill: Capillary refill takes less than 2 seconds.     Findings: No rash.  Neurological:     General: No focal deficit present.     Mental Status: He is alert and oriented to person, place, and time. Mental status is at baseline.     Cranial Nerves: No dysarthria or facial asymmetry.  Psychiatric:        Mood and Affect: Mood normal.        Speech: Speech normal.        Behavior: Behavior normal.        Thought Content: Thought content normal.        Judgment: Judgment normal.    Right elbow   UC Treatments / Results  Labs (all labs ordered are listed, but only abnormal results are displayed) Labs Reviewed - No data to display  EKG   Radiology No results found.  Procedures Join Aspiration/Injection  Date/Time: 08/21/2023 3:11 PM  Performed by: Carlisle Beers, FNP Authorized by: Carlisle Beers, FNP   Consent:    Consent obtained:  Verbal   Consent given by:  Patient   Risks, benefits, and alternatives were discussed: yes     Risks discussed:  Bleeding, incomplete drainage, infection and nerve damage   Alternatives discussed:  No treatment Universal protocol:    Patient identity confirmed:  Verbally with patient Location:    Location:  Elbow   Elbow:  R elbow Anesthesia:    Anesthesia method:  None Procedure details:    Needle gauge:  18 G   Approach:  Posterior   Aspirate amount:    Aspirate characteristics:  Bloody   Steroid injected: no     Specimen collected: no   Post-procedure details:    Dressing:  Gauze roll (gauze and coban pressure wrap)   Procedure completion:  Tolerated well, no immediate complications  (including critical care time)  Medications Ordered in UC Medications - No data to display  Initial Impression / Assessment and Plan / UC Course  I have reviewed the triage vital signs and the nursing notes.  Pertinent labs & imaging  results that were available during my care of the patient were reviewed by me and considered in my medical decision making (see chart for details).   1. Olecranon bursitis, effusion of olecranon bursa Drained olecranon bursa infusion, see procedure note above. Will manage cellulitis with doxycycline BID for 7 days. Compression with gauze/coban wrap consistently for the next 2-3 days. Infection return precautions discussed. Advised against home care in the future for this type of problem, advised to come to clinic for I&D instead in the future.  Counseled patient on potential for adverse effects with medications prescribed/recommended today, strict ER and return-to-clinic precautions discussed, patient verbalized understanding.    Final Clinical Impressions(s) / UC Diagnoses   Final diagnoses:  Olecranon bursitis, right elbow  Effusion of olecranon bursa, right     Discharge Instructions      Take doxycycline antibiotic twice daily for the next 7 days. Apply warm compresses to the elbow to reduce swelling and irritation. Apply compression to the right elbow for the next 2-3 days with ACE wrap and gauze.   If you develop any new or worsening symptoms or if your symptoms do not start to improve, please return here or follow-up with your primary care provider. If your symptoms are severe, please go to the emergency room.      ED Prescriptions     Medication Sig Dispense  Auth. Provider   doxycycline (VIBRAMYCIN) 100 MG capsule Take 1 capsule (100 mg total) by mouth 2 (two) times daily for 7 days. 14 capsule Carlisle Beers, FNP      PDMP not reviewed this encounter.   Carlisle Beers, [place] 08/21/23 [phone]

## 2023-08-21 NOTE — Discharge Instructions (Addendum)
Take doxycycline antibiotic twice daily for the next 7 days. Apply warm compresses to the elbow to reduce swelling and irritation. Apply compression to the right elbow for the next 2-3 days with ACE wrap and gauze.   If you develop any new or worsening symptoms or if your symptoms do not start to improve, please return here or follow-up with your primary care provider. If your symptoms are severe, please go to the emergency room.

## 2023-08-27 ENCOUNTER — Ambulatory Visit: Payer: 59 | Admitting: Family Medicine

## 2023-08-27 ENCOUNTER — Ambulatory Visit: Payer: 59 | Admitting: Nurse Practitioner

## 2023-08-27 VITALS — BP 110/78 | HR 102 | Temp 98.2°F | Ht 73.5 in | Wt 247.4 lb

## 2023-08-27 DIAGNOSIS — M71121 Other infective bursitis, right elbow: Secondary | ICD-10-CM | POA: Diagnosis not present

## 2023-08-27 MED ORDER — SUMATRIPTAN 20 MG/ACT NA SOLN: 20.0000 mg | NASAL | 5 refills | Status: DC | PRN

## 2023-08-27 NOTE — Progress Notes (Signed)
Seth Wittmeyer T. Maloni Musleh, MD, CAQ Sports Medicine [facility] at [facility] [address] [place], [address]  Phone: [phone]  FAX: [phone]  Seth Booker - 47 y.o. male  MRN [id number]  Date of Birth: 06-Oct-1976  Date: 08/27/2023  PCP: Eden Emms, NP  Referral: Eden Emms, NP  Chief Complaint  Patient presents with   Olecranon Bursitis    Right Elbow-Seen in ER 08/21/2023   Subjective:   Seth Booker is a 47 y.o. very pleasant male patient with Body mass index is 32.19 kg/m. who presents with the following:  The patient presents with some right sided swollen, warm olecranon bursa that started to show up roughly 2 weeks ago.  It became quite swollen, red and painful, and the patient incised and drained his own olecranon bursa with some taxidermy scalpel.  At the same time he was taking some amoxicillin that he had at home.  10/31 - last Thursday went to [facility]. Drained and placed on Doxy 7 days -no culture was sent Had been taking Amoxicillin prior to then.   Hurting more than it ever has.  He has been taking doxycycline 1 tablet p.o. twice daily for the last 7 days, and he has 1 more additional dose He has pain with rest, warmth, and redness.  Sees Kristeen Miss, has had several surgeries with [facility].  Review of Systems is noted in the HPI, as appropriate  Objective:   BP 110/78 (BP Location: Left Arm, Patient Position: Sitting, Cuff Size: Large)   Pulse (!) 102   Temp 98.2 F (36.8 C) (Temporal)   Ht 6' 1.5" (1.867 m)   Wt 247 lb 6 oz (112.2 kg)   SpO2 99%   BMI 32.19 kg/m   GEN: No acute distress; alert,appropriate. PULM: Breathing comfortably in no respiratory distress PSYCH: Normally interactive.     Warm and tender to palpation  Laboratory and Imaging Data:  Assessment and Plan:     ICD-10-CM   1. Septic olecranon bursitis of right elbow  M71.121      History and exam are consistent with a septic  olecranon bursa on the right side.  He continues to have pain which is worsened over the last 7 days despite being on doxycycline.  I think at this point we need to involve orthopedic surgery.  They may feel comfortable aspirating this and putting him on more antibiotics, however in refractory cases the patient sometimes needs to be taken to the operating room.  I have personally communicated with Delbert Harness on the telephone, and the patient has an appointment at 2 PM this afternoon.  Patient Instructions  Josh Chadwell 2 PM, [name]   Medication Management during today's office visit: Meds ordered this encounter  Medications   SUMAtriptan (IMITREX) 20 MG/ACT nasal spray    Sig: Place 1 spray (20 mg total) into the nose every 2 (two) hours as needed for migraine or headache. May repeat in 2 hours if headache persists or recurs.    Dispense:  1 each    Refill:  5     [employer] One speech-to-text software was used for transcription in this dictation.  Possible transcriptional errors can occur using Animal nutritionist.   Signed,  Elpidio Galea. Christena Sunderlin, MD   Outpatient Encounter Medications as of 08/27/2023  Medication Sig   doxycycline (VIBRAMYCIN) 100 MG capsule Take 1 capsule (100 mg total) by mouth 2 (two) times daily for 7 days.  EPINEPHrine 0.3 mg/0.3 mL IJ SOAJ injection INJECT 1 PEN IN THE MUSCLE ONE TIME AS DIRECTED PER INSTRUCTIONS IN PACK   fluticasone (FLONASE) 50 MCG/ACT nasal spray SHAKE LIQUID AND USE 2 SPRAYS IN EACH NOSTRIL DAILY   SUMAtriptan (IMITREX) 20 MG/ACT nasal spray Place 1 spray (20 mg total) into the nose every 2 (two) hours as needed for migraine or headache. May repeat in 2 hours if headache persists or recurs.   [DISCONTINUED] hydrOXYzine (VISTARIL) 25 MG capsule Take 1 capsule (25 mg total) by mouth 2 (two) times daily as needed.   [DISCONTINUED] montelukast (SINGULAIR) 10 MG tablet Take 1 tablet (10 mg total) by mouth at bedtime.   [DISCONTINUED]  SUMAtriptan (IMITREX) 20 MG/ACT nasal spray Place 1 spray (20 mg total) into the nose every 2 (two) hours as needed for migraine or headache. May repeat in 2 hours if headache persists or recurs.   No facility-administered encounter medications on file as of 08/27/2023.

## 2023-08-27 NOTE — Patient Instructions (Signed)
Seth Booker 2 PM, [name]

## 2023-10-22 HISTORY — PX: ELBOW FRACTURE SURGERY: SHX616

## 2024-04-15 ENCOUNTER — Ambulatory Visit: Admitting: Nurse Practitioner

## 2024-04-15 VITALS — BP 150/100 | HR 108 | Temp 97.9°F | Ht 73.5 in | Wt 282.6 lb

## 2024-04-15 DIAGNOSIS — R21 Rash and other nonspecific skin eruption: Secondary | ICD-10-CM | POA: Diagnosis not present

## 2024-04-15 DIAGNOSIS — E669 Obesity, unspecified: Secondary | ICD-10-CM

## 2024-04-15 DIAGNOSIS — G4733 Obstructive sleep apnea (adult) (pediatric): Secondary | ICD-10-CM

## 2024-04-15 DIAGNOSIS — G43109 Migraine with aura, not intractable, without status migrainosus: Secondary | ICD-10-CM | POA: Diagnosis not present

## 2024-04-15 DIAGNOSIS — R7989 Other specified abnormal findings of blood chemistry: Secondary | ICD-10-CM

## 2024-04-15 DIAGNOSIS — R030 Elevated blood-pressure reading, without diagnosis of hypertension: Secondary | ICD-10-CM

## 2024-04-15 DIAGNOSIS — Z13220 Encounter for screening for lipoid disorders: Secondary | ICD-10-CM

## 2024-04-15 DIAGNOSIS — Z131 Encounter for screening for diabetes mellitus: Secondary | ICD-10-CM

## 2024-04-15 DIAGNOSIS — R5383 Other fatigue: Secondary | ICD-10-CM

## 2024-04-15 DIAGNOSIS — G4709 Other insomnia: Secondary | ICD-10-CM | POA: Diagnosis not present

## 2024-04-15 MED ORDER — CETIRIZINE HCL 10 MG PO TABS: 10.0000 mg | ORAL_TABLET | Freq: Every day | ORAL | 1 refills | Status: DC

## 2024-04-15 MED ORDER — TRAZODONE HCL 50 MG PO TABS: 25.0000 mg | ORAL_TABLET | Freq: Every evening | ORAL | 1 refills | Status: DC | PRN

## 2024-04-15 MED ORDER — METHYLPREDNISOLONE ACETATE 40 MG/ML IJ SUSP
40.0000 mg | Freq: Once | INTRAMUSCULAR | Status: AC
  Administered 2024-04-15: 40 mg via INTRAMUSCULAR

## 2024-04-15 NOTE — Assessment & Plan Note (Signed)
 Ambiguous in nature recently had a beach trip we will treat with Depo-Medrol 40 mg IM x 1 dose and Zyrtec 10 mg daily

## 2024-04-15 NOTE — Progress Notes (Signed)
 Acute Office Visit  Subjective:     Patient ID: Seth Booker, male    DOB: 02/18/1976, 48 y.o.   MRN: [id number]  Chief Complaint  Patient presents with   HRT    Pt complains of need for full bloodwork. States that bluesky gave pt Kratom with CBD. Pt complains of extreme fatigue with headaches and feeling anxious. Pt states he has trouble sleeping. Pt is taking goodies for headaches.    Medication Refill    Epipen    Anxiety     Patient is in today for multiple complaints with a history of migraine headaches, allergies, GAD, insomnia   Headaches: patient does have a history of migraines. Hx of using nasal sumatriptan  with relief. States that he is having them 2 times  amonth. In the forntal lob e near the left area. He will get nuasea. He will get blurred vision prior to the headache. He can wake up. States that he has them for approx 6 months. States that it is in the crown that is thorbbing and can be twice a day and some days he does not have them. Goodys does help  He has tried excedrin, ibu, tylenol  that is no helpful   Difficulty sleeping: hx of the same that was evaluated in 2023. At that time he was undergoing a separation and he was written hydroxyzine  25mg  to try prn. Patient does have a history of OSA with use of CPAP. States that after 2 months he felt that life settled down and he was ok. States that the past 6 months or so. States that he is having trouble getting to sleep and sometimes staying asleep. States that his mind is racing sometimes. He has tried melatonoin, trazodone (that has helped)  States that he does have a history of sleep apnea and cannot tolerate the mask and would like to do the inspire   Rash: state that he had a rash befre the beach trip. June 6th he got a steroid shot for posion   Fatigue: states that he has been doing TRT for approx 7 years intermittent. States that he was seen by plastic sugeron. States that he started with injections ad then  went to pellets. States that he went to [employer] for cost. States that he is back on the shots currenlty. 200mg  a week. States that he took his last shot two weeks ago.    Review of Systems  Constitutional:  Negative for chills and fever.  Respiratory:  Negative for shortness of breath.   Cardiovascular:  Negative for chest pain and leg swelling.  Gastrointestinal:  Negative for abdominal pain, blood in stool, constipation, diarrhea, nausea and vomiting.       BM daily   Genitourinary:  Negative for dysuria and hematuria.  Neurological:  Positive for headaches. Negative for tingling.  Psychiatric/Behavioral:  Negative for hallucinations and suicidal ideas. The patient has insomnia.         Objective:    BP (!) 150/100   Pulse (!) 108   Temp 97.9 F (36.6 C) (Oral)   Ht 6' 1.5 (1.867 m)   Wt 282 lb 9.6 oz (128.2 kg)   SpO2 97%   BMI 36.78 kg/m  BP Readings from Last 3 Encounters:  04/15/24 (!) 150/100  08/27/23 110/78  08/21/23 138/82   Wt Readings from Last 3 Encounters:  04/15/24 282 lb 9.6 oz (128.2 kg)  08/27/23 247 lb 6 oz (112.2 kg)  03/04/23 280 lb (127 kg)  SpO2 Readings from Last 3 Encounters:  04/15/24 97%  08/27/23 99%  08/21/23 96%      Physical Exam Vitals and nursing note reviewed.  Constitutional:      Appearance: Normal appearance.  HENT:     Right Ear: Tympanic membrane, ear canal and external ear normal.     Left Ear: Tympanic membrane, ear canal and external ear normal.     Mouth/Throat:     Mouth: Mucous membranes are moist.     Pharynx: Oropharynx is clear.   Eyes:     Extraocular Movements: Extraocular movements intact.     Pupils: Pupils are equal, round, and reactive to light.    Cardiovascular:     Rate and Rhythm: Normal rate and regular rhythm.     Pulses: Normal pulses.     Heart sounds: Normal heart sounds.  Pulmonary:     Effort: Pulmonary effort is normal.     Breath sounds: Normal breath sounds.  Abdominal:      General: Bowel sounds are normal. There is no distension.     Palpations: There is no mass.     Tenderness: There is no abdominal tenderness.     Hernia: No hernia is present.     Comments: BS WNL  Rectus diastasis    Musculoskeletal:     Right lower leg: No edema.     Left lower leg: No edema.  Lymphadenopathy:     Cervical: No cervical adenopathy.   Skin:    General: Skin is warm.     Findings: Rash present.       Neurological:     General: No focal deficit present.     Mental Status: He is alert.     Deep Tendon Reflexes:     Reflex Scores:      Bicep reflexes are 2+ on the right side and 2+ on the left side.      Patellar reflexes are 2+ on the right side and 2+ on the left side.    Comments: Bilateral upper and lower extremity strength 5/5  Psychiatric:        Mood and Affect: Mood normal.        Behavior: Behavior normal.        Thought Content: Thought content normal.        Judgment: Judgment normal.     No results found for any visits on 04/15/24.      Assessment & Plan:   Problem List Items Addressed This Visit       Cardiovascular and Mediastinum   Headache, migraine   History of the same no more than twice a month.  Patient needs Imitrex  as needed and will abort migraine.  Patient having a different type of headache that is responding to [place] powders.  Patient not drinking enough fluid encouraged 64 ounces of water a day.  States he does drink a lot of soda.  Patient also did back off of NSAID use.  Hopefully getting adequate sleep will help with the headaches. Neuro exam benign       Relevant Medications   traZODone (DESYREL) 50 MG tablet     Respiratory   OSA (obstructive sleep apnea)   History of the same.  Patient is unable to use CPAP as of late due to tossing and turning and inability to sleep.  Patient is interested in the inspire device ambulatory referral to pulmonology for further evaluation and workup      Relevant Orders  Ambulatory referral to Pulmonology     Musculoskeletal and Integument   Rash - Primary   Ambiguous in nature recently had a beach trip we will treat with Depo-Medrol 40 mg IM x 1 dose and Zyrtec 10 mg daily        Other   Other insomnia   History of the same.  Patient tried hydroxyzine  in the past without great relief he has tried trazodone with good relief.  Will start with trazodone 25 to 50 mg nightly as needed.      Relevant Medications   traZODone (DESYREL) 50 MG tablet   Fatigue   Ambiguous in nature multifactorial.  Patient has had a rapid increase in weight not being able to tolerate his CPAP not drinking enough water.  Also decrease in testosterone use we will check basic labs inclusive of CBC, CMP, TSH, A1c, B12, iron, vitamin D, and testosterone.      Relevant Orders   Vitamin B12   TSH   VITAMIN D 25 Hydroxy (Vit-D Deficiency, Fractures)   IBC + Ferritin   Obesity (BMI 30-39.9)   History of the same patient has had a 40 pound increase over the past 6 to 7 months.  Query if this is impacting patient's fatigue will check basic labs inclusive of TSH, A1c, lipid panel.      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  He was doing 200 mg IM weekly per his report      Relevant Orders   Testosterone   Elevated blood pressure reading   Elevated blood pressure reading upon initial and recheck today.  Historically blood pressure has been well-controlled.  Patient can check blood pressure at work he will spot check 2 times a week and write down the readings for me.  This could be contributing to patient's headaches.      Relevant Orders   CBC   Comprehensive metabolic panel with GFR   TSH   Other Visit Diagnoses       Screening for diabetes mellitus       Relevant Orders   Hemoglobin  A1c     Screening for lipid disorders       Relevant Orders   Lipid panel       Meds ordered this encounter  Medications   methylPREDNISolone acetate (DEPO-MEDROL) injection 40 mg   cetirizine (ZYRTEC) 10 MG tablet    Sig: Take 1 tablet (10 mg total) by mouth daily.    Dispense:  30 tablet    Refill:  1    Supervising Provider:   RANDEEN HARDY A [[id number]]   traZODone (DESYREL) 50 MG tablet    Sig: Take 0.5-1 tablets (25-50 mg total) by mouth at bedtime as needed for sleep.    Dispense:  30 tablet    Refill:  1    Supervising Provider:   RANDEEN HARDY A [[id number]]    Return in about 4 weeks (around 05/13/2024) for BP recheck/fatigue.  Adina Crandall, NP

## 2024-04-15 NOTE — Assessment & Plan Note (Signed)
 Elevated blood pressure reading upon initial and recheck today.  Historically blood pressure has been well-controlled.  Patient can check blood pressure at work he will spot check 2 times a week and write down the readings for me.  This could be contributing to patient's headaches.

## 2024-04-15 NOTE — Assessment & Plan Note (Signed)
 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  He was doing 200 mg IM weekly per his report

## 2024-04-15 NOTE — Assessment & Plan Note (Signed)
 Ambiguous in nature multifactorial.  Patient has had a rapid increase in weight not being able to tolerate his CPAP not drinking enough water.  Also decrease in testosterone use we will check basic labs inclusive of CBC, CMP, TSH, A1c, B12, iron, vitamin D, and testosterone.

## 2024-04-15 NOTE — Assessment & Plan Note (Signed)
 History of the same.  Patient is unable to use CPAP as of late due to tossing and turning and inability to sleep.  Patient is interested in the inspire device ambulatory referral to pulmonology for further evaluation and workup

## 2024-04-15 NOTE — Assessment & Plan Note (Addendum)
 History of the same no more than twice a month.  Patient needs Imitrex  as needed and will abort migraine.  Patient having a different type of headache that is responding to [place] powders.  Patient not drinking enough fluid encouraged 64 ounces of water a day.  States he does drink a lot of soda.  Patient also did back off of NSAID use.  Hopefully getting adequate sleep will help with the headaches. Neuro exam benign

## 2024-04-15 NOTE — Assessment & Plan Note (Signed)
 History of the same patient has had a 40 pound increase over the past 6 to 7 months.  Query if this is impacting patient's fatigue will check basic labs inclusive of TSH, A1c, lipid panel.

## 2024-04-15 NOTE — Patient Instructions (Signed)
 Nice to see you today Make a 1 month follow up with me Check Blood pressure at work 2 ties a week and write the numbers down  Make a fasting lab appointment within the next week or so for 930 am or earlier

## 2024-04-15 NOTE — Assessment & Plan Note (Signed)
 History of the same.  Patient tried hydroxyzine  in the past without great relief he has tried trazodone with good relief.  Will start with trazodone 25 to 50 mg nightly as needed.

## 2024-04-22 ENCOUNTER — Other Ambulatory Visit

## 2024-05-04 ENCOUNTER — Other Ambulatory Visit (INDEPENDENT_AMBULATORY_CARE_PROVIDER_SITE_OTHER)

## 2024-05-04 DIAGNOSIS — R030 Elevated blood-pressure reading, without diagnosis of hypertension: Secondary | ICD-10-CM

## 2024-05-04 DIAGNOSIS — R5383 Other fatigue: Secondary | ICD-10-CM | POA: Diagnosis not present

## 2024-05-04 DIAGNOSIS — E669 Obesity, unspecified: Secondary | ICD-10-CM

## 2024-05-04 DIAGNOSIS — R7989 Other specified abnormal findings of blood chemistry: Secondary | ICD-10-CM

## 2024-05-04 DIAGNOSIS — Z131 Encounter for screening for diabetes mellitus: Secondary | ICD-10-CM

## 2024-05-04 DIAGNOSIS — Z13220 Encounter for screening for lipoid disorders: Secondary | ICD-10-CM | POA: Diagnosis not present

## 2024-05-04 LAB — COMPREHENSIVE METABOLIC PANEL WITH GFR
ALT: 14 U/L (ref 0–53)
AST: 12 U/L (ref 0–37)
Albumin: 4.1 g/dL (ref 3.5–5.2)
Alkaline Phosphatase: 63 U/L (ref 39–117)
BUN: 17 mg/dL (ref 6–23)
CO2: 28 meq/L (ref 19–32)
Calcium: 8.9 mg/dL (ref 8.4–10.5)
Chloride: 105 meq/L (ref 96–112)
Creatinine, Ser: 1.14 mg/dL (ref 0.40–1.50)
GFR: 76.29 mL/min (ref >60.00)
Glucose, Bld: 91 mg/dL (ref 70–99)
Potassium: 4.4 meq/L (ref 3.5–5.1)
Sodium: 138 meq/L (ref 135–145)
Total Bilirubin: 0.4 mg/dL (ref 0.2–1.2)
Total Protein: 6.6 g/dL (ref 6.0–8.3)

## 2024-05-04 LAB — CBC
HCT: 44.9 % (ref 39.0–52.0)
Hemoglobin: 14.6 g/dL (ref 13.0–17.0)
MCHC: 32.6 g/dL (ref 30.0–36.0)
MCV: 84 fl (ref 78.0–100.0)
Platelets: 320 K/uL (ref 150.0–400.0)
RBC: 5.34 Mil/uL (ref 4.22–5.81)
RDW: 14.9 % (ref 11.5–15.5)
WBC: 7.7 K/uL (ref 4.0–10.5)

## 2024-05-04 LAB — VITAMIN D 25 HYDROXY (VIT D DEFICIENCY, FRACTURES): VITD: 27.43 ng/mL — ABNORMAL LOW (ref 30.00–100.00)

## 2024-05-04 LAB — TESTOSTERONE: Testosterone: 68.63 ng/dL — ABNORMAL LOW (ref 300.00–890.00)

## 2024-05-04 LAB — TSH: TSH: 0.47 u[IU]/mL (ref 0.35–5.50)

## 2024-05-04 LAB — IBC + FERRITIN
Ferritin: 78.8 ng/mL (ref 22.0–322.0)
Iron: 63 ug/dL (ref 42–165)
Saturation Ratios: 14 % — ABNORMAL LOW (ref 20.0–50.0)
TIBC: 449.4 ug/dL (ref 250.0–450.0)
Transferrin: 321 mg/dL (ref 212.0–360.0)

## 2024-05-04 LAB — HEMOGLOBIN A1C: Hgb A1c MFr Bld: 5.7 % (ref 4.6–6.5)

## 2024-05-04 LAB — LIPID PANEL
Cholesterol: 123 mg/dL (ref 0–200)
HDL: 45.8 mg/dL (ref >39.00)
LDL Cholesterol: 49 mg/dL (ref 0–99)
NonHDL: 77.61
Total CHOL/HDL Ratio: 3
Triglycerides: 144 mg/dL (ref 0.0–149.0)
VLDL: 28.8 mg/dL (ref 0.0–40.0)

## 2024-05-04 LAB — VITAMIN B12: Vitamin B-12: 251 pg/mL (ref 211–911)

## 2024-05-05 ENCOUNTER — Ambulatory Visit: Payer: Self-pay | Admitting: Nurse Practitioner

## 2024-05-05 DIAGNOSIS — Z1211 Encounter for screening for malignant neoplasm of colon: Secondary | ICD-10-CM

## 2024-05-08 ENCOUNTER — Other Ambulatory Visit: Payer: Self-pay | Admitting: Nurse Practitioner

## 2024-05-08 DIAGNOSIS — G4709 Other insomnia: Secondary | ICD-10-CM

## 2024-05-13 ENCOUNTER — Encounter: Payer: Self-pay | Admitting: Nurse Practitioner

## 2024-05-13 ENCOUNTER — Ambulatory Visit: Admitting: Nurse Practitioner

## 2024-05-13 VITALS — BP 132/80 | HR 97 | Temp 97.5°F | Ht 73.5 in | Wt 267.4 lb

## 2024-05-13 DIAGNOSIS — R7989 Other specified abnormal findings of blood chemistry: Secondary | ICD-10-CM

## 2024-05-13 DIAGNOSIS — G4733 Obstructive sleep apnea (adult) (pediatric): Secondary | ICD-10-CM | POA: Diagnosis not present

## 2024-05-13 DIAGNOSIS — F4323 Adjustment disorder with mixed anxiety and depressed mood: Secondary | ICD-10-CM | POA: Diagnosis not present

## 2024-05-13 MED ORDER — VENLAFAXINE HCL ER 37.5 MG PO CP24: 37.5000 mg | ORAL_CAPSULE | Freq: Every day | ORAL | 0 refills | Status: DC

## 2024-05-13 NOTE — Assessment & Plan Note (Signed)
 Patient is intolerant of CPAP but having symptoms of untreated sleep apnea.  Patient was referred to pulmonology.  He has not hernia thank for referral patient was given information to call the office directly to set up an appointment.

## 2024-05-13 NOTE — Progress Notes (Signed)
 Established Patient Office Visit  Subjective   Patient ID: Seth Booker, male    DOB: 01/07/1976  Age: 48 y.o. MRN: [id number]  Chief Complaint  Patient presents with   Follow-up    Pt does not check BP at home. States he's fine and fatigue has improved a little. Pt would like to discuss abnormal lab results.    Medication Refill    Epi-pen     HPI  Elevated blood pressure: Patient was last seen by me on 04/15/2024 for multiple complaints.  Patient's blood pressure was elevated on initial and recheck but previously blood pressure has been normal he is here for recheck.  Blood pressure is within normal limits today.  Insomnia: Patient has tried and failed hydroxyzine  in the past.  We did start patient on trazodone  25 to 50 mg nightly as needed. States that he is taking half tablet 5 times and does help. States that he is doing 8-8 vs 4-4. He is feeling better with that.  TRT: he has been off the testosterone  2 months and he was 100mg  a week.  Testosterone  levels were 68.  Patient is interested in getting it replaced again   Headaches: states that he is having some resolve with the headaches but is having them mainly in the morning. He does have a history of sleep apnea but is not tolerating the  CPAP.  Patient was referred to pulmonology at last visit  Anxiety/depression: Patient currently undergoing a divorce states he has court coming up.  He does have a lot of stressors and had a relapse of using pain medication to cope.  Patient has been off the medication for several weeks.  He is sleeping decently well.  Patient denies HI/SI/AVH     04/15/2024    3:48 PM 08/27/2023    9:49 AM 07/25/2022   12:28 PM  PHQ9 SCORE ONLY  PHQ-9 Total Score 15 0 6       04/15/2024    3:51 PM 07/25/2022   12:29 PM  GAD 7 : Generalized Anxiety Score  Nervous, Anxious, on Edge 3 3  Control/stop worrying 3 2  Worry too much - different things 3 2  Trouble relaxing 2 1  Restless 2 0  Easily annoyed  or irritable 2 3  Afraid - awful might happen 1 0  Total GAD 7 Score 16 11  Anxiety Difficulty Somewhat difficult Somewhat difficult        Review of Systems  Constitutional:  Negative for chills and fever.  Respiratory:  Negative for shortness of breath.   Cardiovascular:  Negative for chest pain.  Neurological:  Negative for dizziness and headaches.      Objective:     BP 132/80   Pulse 97   Temp (!) 97.5 F (36.4 C) (Oral)   Ht 6' 1.5 (1.867 m)   Wt 267 lb 6.4 oz (121.3 kg)   SpO2 98%   BMI 34.80 kg/m  BP Readings from Last 3 Encounters:  05/13/24 132/80  04/15/24 (!) 150/100  08/27/23 110/78   Wt Readings from Last 3 Encounters:  05/13/24 267 lb 6.4 oz (121.3 kg)  04/15/24 282 lb 9.6 oz (128.2 kg)  08/27/23 247 lb 6 oz (112.2 kg)   SpO2 Readings from Last 3 Encounters:  05/13/24 98%  04/15/24 97%  08/27/23 99%      Physical Exam Vitals and nursing note reviewed.  Constitutional:      Appearance: Normal appearance.  Cardiovascular:  Rate and Rhythm: Normal rate and regular rhythm.     Heart sounds: Normal heart sounds.  Pulmonary:     Effort: Pulmonary effort is normal.     Breath sounds: Normal breath sounds.  Neurological:     Mental Status: He is alert.      No results found for any visits on 05/13/24.    The ASCVD Risk score (Arnett DK, et al., 2019) failed to calculate for the following reasons:   The valid total cholesterol range is 130 to 320 mg/dL    Assessment & Plan:   Problem List Items Addressed This Visit       Respiratory   OSA (obstructive sleep apnea)   Patient is intolerant of CPAP but having symptoms of untreated sleep apnea.  Patient was referred to pulmonology.  He has not hernia thank for referral patient was given information to call the office directly to set up an appointment.        Other   Low testosterone  - Primary   History of the same with replacement.  Patient's been off of replacement for  approximately 2 months.  Patient's previous testosterone  68.  Ambulatory referral to urology for TRT      Relevant Orders   Ambulatory referral to Urology   Adjustment disorder with mixed anxiety and depressed mood   Will start patient on venlafaxine  37.5 mg daily.  Patient denies HI/SI/AVH.      Relevant Medications   venlafaxine  XR (EFFEXOR  XR) 37.5 MG 24 hr capsule    Return in about 6 weeks (around 06/24/2024) for MDD/GAD.    Adina Crandall, NP

## 2024-05-13 NOTE — Patient Instructions (Addendum)
 Get over the counter vitamin D  1000IU and take it daily   Pulmonology group call and schedule  [facility] [address] [place] [id number] [phone]

## 2024-05-13 NOTE — Assessment & Plan Note (Signed)
 History of the same with replacement.  Patient's been off of replacement for approximately 2 months.  Patient's previous testosterone  68.  Ambulatory referral to urology for TRT

## 2024-05-13 NOTE — Assessment & Plan Note (Signed)
 Will start patient on venlafaxine  37.5 mg daily.  Patient denies HI/SI/AVH.

## 2024-05-31 NOTE — Addendum Note (Signed)
 Addended by: WENDEE LYNWOOD HERO on: 05/31/2024 04:06 PM   Modules accepted: Orders

## 2024-06-09 ENCOUNTER — Encounter (HOSPITAL_COMMUNITY): Payer: Self-pay | Admitting: *Deleted

## 2024-06-09 ENCOUNTER — Emergency Department (HOSPITAL_BASED_OUTPATIENT_CLINIC_OR_DEPARTMENT_OTHER)
Admission: EM | Admit: 2024-06-09 | Discharge: 2024-06-09 | Disposition: A | Discharge status: Home / Self Care | Attending: Emergency Medicine | Admitting: Emergency Medicine

## 2024-06-09 ENCOUNTER — Other Ambulatory Visit: Payer: Self-pay

## 2024-06-09 ENCOUNTER — Ambulatory Visit (INDEPENDENT_AMBULATORY_CARE_PROVIDER_SITE_OTHER)

## 2024-06-09 ENCOUNTER — Ambulatory Visit (HOSPITAL_COMMUNITY)
Admission: EM | Admit: 2024-06-09 | Discharge: 2024-06-09 | Disposition: A | Discharge status: Home / Self Care | Attending: Internal Medicine | Admitting: Internal Medicine

## 2024-06-09 ENCOUNTER — Emergency Department (HOSPITAL_BASED_OUTPATIENT_CLINIC_OR_DEPARTMENT_OTHER)

## 2024-06-09 DIAGNOSIS — R109 Unspecified abdominal pain: Secondary | ICD-10-CM

## 2024-06-09 DIAGNOSIS — Z87442 Personal history of urinary calculi: Secondary | ICD-10-CM | POA: Insufficient documentation

## 2024-06-09 DIAGNOSIS — K5792 Diverticulitis of intestine, part unspecified, without perforation or abscess without bleeding: Secondary | ICD-10-CM | POA: Diagnosis not present

## 2024-06-09 DIAGNOSIS — D72829 Elevated white blood cell count, unspecified: Secondary | ICD-10-CM | POA: Diagnosis not present

## 2024-06-09 LAB — CBC WITH DIFFERENTIAL/PLATELET
Abs Immature Granulocytes: 0.06 K/uL (ref 0.00–0.07)
Basophils Absolute: 0.1 K/uL (ref 0.0–0.1)
Basophils Relative: 0 %
Eosinophils Absolute: 0.4 K/uL (ref 0.0–0.5)
Eosinophils Relative: 3 %
HCT: 42.7 % (ref 39.0–52.0)
Hemoglobin: 14.6 g/dL (ref 13.0–17.0)
Immature Granulocytes: 1 %
Lymphocytes Relative: 19 %
Lymphs Abs: 2.5 K/uL (ref 0.7–4.0)
MCH: 28.8 pg (ref 26.0–34.0)
MCHC: 34.2 g/dL (ref 30.0–36.0)
MCV: 84.2 fL (ref 80.0–100.0)
Monocytes Absolute: 1.4 K/uL — ABNORMAL HIGH (ref 0.1–1.0)
Monocytes Relative: 11 %
Neutro Abs: 8.3 K/uL — ABNORMAL HIGH (ref 1.7–7.7)
Neutrophils Relative %: 66 %
Platelets: 279 K/uL (ref 150–400)
RBC: 5.07 MIL/uL (ref 4.22–5.81)
RDW: 15.6 % — ABNORMAL HIGH (ref 11.5–15.5)
WBC: 12.7 K/uL — ABNORMAL HIGH (ref 4.0–10.5)
nRBC: 0 % (ref 0.0–0.2)

## 2024-06-09 LAB — URINALYSIS, ROUTINE W REFLEX MICROSCOPIC
Bacteria, UA: NONE SEEN
Bilirubin Urine: NEGATIVE
Glucose, UA: NEGATIVE mg/dL
Leukocytes,Ua: NEGATIVE
Nitrite: NEGATIVE
Specific Gravity, Urine: 1.023 (ref 1.005–1.030)
pH: 6 (ref 5.0–8.0)

## 2024-06-09 LAB — COMPREHENSIVE METABOLIC PANEL WITH GFR
ALT: 15 U/L (ref 0–44)
AST: 16 U/L (ref 15–41)
Albumin: 4.1 g/dL (ref 3.5–5.0)
Alkaline Phosphatase: 68 U/L (ref 38–126)
Anion gap: 13 (ref 5–15)
BUN: 9 mg/dL (ref 6–20)
CO2: 22 mmol/L (ref 22–32)
Calcium: 9.3 mg/dL (ref 8.9–10.3)
Chloride: 103 mmol/L (ref 98–111)
Creatinine, Ser: 1.37 mg/dL — ABNORMAL HIGH (ref 0.61–1.24)
GFR, Estimated: >60 mL/min (ref >60)
Glucose, Bld: 104 mg/dL — ABNORMAL HIGH (ref 70–99)
Potassium: 3.8 mmol/L (ref 3.5–5.1)
Sodium: 138 mmol/L (ref 135–145)
Total Bilirubin: 0.8 mg/dL (ref 0.0–1.2)
Total Protein: 6.9 g/dL (ref 6.5–8.1)

## 2024-06-09 LAB — POCT URINALYSIS DIP (MANUAL ENTRY)
Bilirubin, UA: NEGATIVE
Glucose, UA: NEGATIVE mg/dL
Ketones, POC UA: NEGATIVE mg/dL
Leukocytes, UA: NEGATIVE
Nitrite, UA: NEGATIVE
Spec Grav, UA: 1.02 (ref 1.010–1.025)
Urobilinogen, UA: 0.2 U/dL
pH, UA: 6.5 (ref 5.0–8.0)

## 2024-06-09 LAB — LIPASE, BLOOD: Lipase: 26 U/L (ref 11–51)

## 2024-06-09 MED ORDER — KETOROLAC TROMETHAMINE 15 MG/ML IJ SOLN
15.0000 mg | Freq: Once | INTRAMUSCULAR | Status: AC
  Administered 2024-06-09: 15 mg via INTRAVENOUS
  Filled 2024-06-09: qty 1

## 2024-06-09 MED ORDER — DICYCLOMINE HCL 20 MG PO TABS: 20.0000 mg | ORAL_TABLET | Freq: Two times a day (BID) | ORAL | 0 refills | Status: DC

## 2024-06-09 MED ORDER — ONDANSETRON 4 MG PO TBDP
4.0000 mg | ORAL_TABLET | Freq: Three times a day (TID) | ORAL | 0 refills | Status: DC | PRN
Start: 2024-06-09 — End: 2024-08-23

## 2024-06-09 MED ORDER — AMOXICILLIN-POT CLAVULANATE 875-125 MG PO TABS: 1.0000 | ORAL_TABLET | Freq: Two times a day (BID) | ORAL | 0 refills | Status: AC

## 2024-06-09 MED ORDER — MORPHINE SULFATE (PF) 4 MG/ML IV SOLN
4.0000 mg | Freq: Once | INTRAVENOUS | Status: AC
  Administered 2024-06-09: 4 mg via INTRAVENOUS
  Filled 2024-06-09: qty 1

## 2024-06-09 MED ORDER — AMOXICILLIN-POT CLAVULANATE 875-125 MG PO TABS
1.0000 | ORAL_TABLET | Freq: Once | ORAL | Status: AC
  Administered 2024-06-09: 1 via ORAL
  Filled 2024-06-09: qty 1

## 2024-06-09 MED ORDER — SODIUM CHLORIDE 0.9 % IV BOLUS
1000.0000 mL | Freq: Once | INTRAVENOUS | Status: AC
  Administered 2024-06-09: 1000 mL via INTRAVENOUS

## 2024-06-09 MED ORDER — ONDANSETRON HCL 4 MG/2ML IJ SOLN
4.0000 mg | Freq: Once | INTRAMUSCULAR | Status: AC
  Administered 2024-06-09: 4 mg via INTRAVENOUS
  Filled 2024-06-09: qty 2

## 2024-06-09 NOTE — ED Triage Notes (Signed)
 C/o left sided flank pain and hematuria. Hx of kidney stones.

## 2024-06-09 NOTE — ED Provider Notes (Signed)
 [facility]    CSN: [id number] Arrival date & time: 06/09/24  1002      History   Chief Complaint Chief Complaint  Patient presents with   Abdominal Pain    HPI Seth Booker is a 48 y.o. male presenting with a 1 day history of left lateral abdominal pain.  Pain began around midday and persisted through the evening.  He describes constant pain rated 7 out of 10.  He states that he has had a kidney stone previously but pain feels dissimilar to what he experienced that time.  He denies nausea/vomiting, fever/chills, dysuria/hematuria, and diarrhea/constipation.  He has had a bowel movement since onset of pain that was normal in color and consistency.  He placed a heating pad on his left side overnight which he says is mildly alleviated his discomfort.  Past Medical History:  Diagnosis Date   Anxiety    DDD (degenerative disc disease), cervical    History of drug abuse (HCC)    Migraine    Sleep apnea     Patient Active Problem List   Diagnosis Date Noted   Adjustment disorder with mixed anxiety and depressed mood 05/13/2024   Rash 04/15/2024   Fatigue 04/15/2024   Obesity (BMI 30-39.9) 04/15/2024   Low testosterone  04/15/2024   Elevated blood pressure reading 04/15/2024   Seasonal and perennial allergic rhinoconjunctivitis 02/11/2023   Other allergic rhinitis 08/14/2022   Allergic conjunctivitis of both eyes 08/14/2022   Hymenoptera allergy 08/14/2022   History of cervical spinal surgery 07/25/2022   Paresthesia 07/25/2022   Other insomnia 07/25/2022   Environmental allergies 07/25/2022   OSA (obstructive sleep apnea) 12/27/2013   Headache, migraine 08/14/2013   DDD (degenerative disc disease), cervical 03/27/2013   Affective disorder (HCC) 03/27/2013   Anxiety, generalized 03/27/2013   Compulsive tobacco user syndrome 03/27/2013    Past Surgical History:  Procedure Laterality Date   ADENOIDECTOMY     KNEE SURGERY Left    x 2 arthroscopic   NECK  SURGERY     2 disc fused and titanium cage put in, C4 and C5   TONSILLECTOMY     URETHRA SURGERY       Home Medications    Prior to Admission medications   Medication Sig Start Date End Date Taking? Authorizing Provider  cetirizine  (ZYRTEC ) 10 MG tablet TAKE 1 TABLET BY MOUTH EVERY DAY 05/11/24  Yes Wendee Lynwood HERO, NP  fluticasone (FLONASE) 50 MCG/ACT nasal spray SHAKE LIQUID AND USE 2 SPRAYS IN EACH NOSTRIL DAILY 11/21/21  Yes [provider]  traZODone  (DESYREL ) 50 MG tablet TAKE 0.5-1 TABLETS BY MOUTH AT BEDTIME AS NEEDED FOR SLEEP. 05/11/24  Yes Wendee Lynwood HERO, NP  venlafaxine  XR (EFFEXOR  XR) 37.5 MG 24 hr capsule Take 1 capsule (37.5 mg total) by mouth daily with breakfast. 05/13/24  Yes Wendee Lynwood HERO, NP  EPINEPHrine 0.3 mg/0.3 mL IJ SOAJ injection INJECT 1 PEN IN THE MUSCLE ONE TIME AS DIRECTED PER INSTRUCTIONS IN PACK 11/22/21   [provider]  SUMAtriptan  (IMITREX ) 20 MG/ACT nasal spray Place 1 spray (20 mg total) into the nose every 2 (two) hours as needed for migraine or headache. May repeat in 2 hours if headache persists or recurs. 08/27/23   Watt Mirza, MD    Family History Family History  Problem Relation Age of Onset   Breast cancer Mother    Thyroid  cancer Mother    COPD Father    Emphysema Father    Juvenile  Diabetes Sister     Social History Social History   Tobacco Use   Smoking status: Never   Smokeless tobacco: Current    Types: Chew, Snuff   Tobacco comments:    Dip tobacco use-1 can a day  Vaping Use   Vaping status: Never Used  Substance Use Topics   Alcohol use: Yes    Comment: 3 to 5 drinks a week-on the weekend usually   Drug use: Not Currently    Types: GHB, Cocaine, Fentanyl, Heroin, MDMA (Ecstacy), Ketamine    Comment: sober since 2018   Allergies   Bee venom  Review of Systems Review of Systems  Gastrointestinal:  Positive for abdominal pain (Left lateral).  All other systems reviewed and are negative.  Physical  Exam Triage Vital Signs ED Triage Vitals  Encounter Vitals Group     BP 06/09/24 1102 (!) 142/91     Girls Systolic BP Percentile --      Girls Diastolic BP Percentile --      Boys Systolic BP Percentile --      Boys Diastolic BP Percentile --      Pulse Rate 06/09/24 1102 91     Resp 06/09/24 1102 20     Temp 06/09/24 1102 98.2 F (36.8 C)     Temp src --      SpO2 --      Weight --      Height --      Head Circumference --      Peak Flow --      Pain Score 06/09/24 1100 7     Pain Loc --      Pain Education --      Exclude from Growth Chart --    No data found.  Updated Vital Signs BP (!) 142/91   Pulse 91   Temp 98.2 F (36.8 C)   Resp 20   Physical Exam Vitals reviewed.  Constitutional:      General: He is not in acute distress.    Appearance: He is well-developed. He is obese. He is not toxic-appearing.  HENT:     Head: Normocephalic and atraumatic.  Cardiovascular:     Rate and Rhythm: Normal rate and regular rhythm.  Pulmonary:     Effort: Pulmonary effort is normal.     Breath sounds: Normal breath sounds.  Abdominal:     General: Bowel sounds are normal. There is no distension.     Palpations: Abdomen is soft.     Tenderness: There is abdominal tenderness (Mild TTP left lateral abdominal quadrants) in the left upper quadrant and left lower quadrant. There is no right CVA tenderness, left CVA tenderness, guarding or rebound. Negative signs include Murphy's sign and Rovsing's sign.     Hernia: No hernia is present.  Skin:    General: Skin is warm and dry.  Neurological:     General: No focal deficit present.     Mental Status: He is alert.  Psychiatric:        Mood and Affect: Mood normal.        Behavior: Behavior normal.    UC Treatments / Results  Labs (all labs ordered are listed, but only abnormal results are displayed) Labs Reviewed  POCT URINALYSIS DIP (MANUAL ENTRY) - Abnormal; Notable for the following components:      Result Value    Blood, UA trace-intact (*)    Protein Ur, POC trace (*)    All other components within  normal limits    EKG   Radiology DG Abd 2 Views Result Date: 06/09/2024 CLINICAL DATA:  Acute left-sided abdominal pain. EXAM: ABDOMEN - 2 VIEW COMPARISON:  January 18, 2022. FINDINGS: The bowel gas pattern is normal. There is no evidence of free air. No radio-opaque calculi or other significant radiographic abnormality is seen. IMPRESSION: Negative. Electronically Signed   By: Lynwood Landy Raddle M.D.   On: 06/09/2024 11:44    Procedures Procedures (including critical care time)  Medications Ordered in UC Medications - No data to display  Initial Impression / Assessment and Plan / UC Course  I have reviewed the triage vital signs and the nursing notes.  Pertinent labs & imaging results that were available during my care of the patient were reviewed by me and considered in my medical decision making (see chart for details).    48 year old male presenting to urgent care with a 1 day history of persistent left lateral abdominal pain rated 7/10.  He is unaware of any inciting event or trauma.  Pain is dissimilar to what he experienced with a kidney stone in the past.  On exam there is mild tenderness palpation over the left lateral abdominal quadrants.  No significant rebound tenderness or guarding noted.  His exam is otherwise reassuring.  We discussed potential causes of his pain including GI, GU, and MSK etiologies. Will start with POC UA and KUB.  No acute findings on x-ray.  POC UA shows trace blood but is not consistent with infection.  Patient continues to appear well.  We again reviewed potential etiologies of his symptoms.  It is reassuring that he has been able to urinate without significant pain, has had a bowel movement with normal consistency, and remains afebrile.  He appears comfortable.  We discussed that ultimately a CT scan of the abdomen is the left imaging modality to rule out acute pathology.   This may also simply be a musculoskeletal strain which will improve with time and conservative treatment measures.  Patient is medically stable for discharge at this time.  Recommend as needed use of NSAIDs for pain relief.  We discussed that if his pain persists or worsens he should go to the emergency department for CT scan.  He is agreeable to this plan.  Final Clinical Impressions(s) / UC Diagnoses   Final diagnoses:  Left lateral abdominal pain     Discharge Instructions      No acute findings on xray or urine study. As we discussed, your pain mya be due to a GI, genitourinary, or musculoskeletal cause. Ultimately, a CT scan of the abdomen is the best imaging to rule out acute pathology. I recommend going to the ER if your pain worsens or does not improve. Try ibuprofen for pain relief.      ED Prescriptions   None    PDMP not reviewed this encounter.   Melvenia Manus BRAVO, MD 06/09/24 [phone]

## 2024-06-09 NOTE — Discharge Instructions (Addendum)
 Please take all antibiotics as directed.  Follow-up closely with gastroenterology on an outpatient basis for colonoscopy once your symptoms resolved.  Return to emergency department immediately for any new or worsening symptoms.

## 2024-06-09 NOTE — Discharge Instructions (Signed)
 No acute findings on xray or urine study. As we discussed, your pain mya be due to a GI, genitourinary, or musculoskeletal cause. Ultimately, a CT scan of the abdomen is the best imaging to rule out acute pathology. I recommend going to the ER if your pain worsens or does not improve. Try ibuprofen for pain relief.

## 2024-06-09 NOTE — ED Provider Notes (Signed)
 [place] EMERGENCY DEPARTMENT AT [facility] Provider Note   CSN: [id number] Arrival date & time: 06/09/24  1530     Patient presents with: Flank Pain   Seth Booker is a 48 y.o. male.   Patient is a 48 year old male who presents to the emergency department with a chief complaint of left-sided flank and abdominal pain which has been ongoing since yesterday.  He notes he does have a history of kidney stones and this does feel different than previous.  He denies any associated dysuria.  He does admit to some hematuria.  He denies any associated nausea, vomiting, diarrhea or constipation.   Flank Pain       Prior to Admission medications   Medication Sig Start Date End Date Taking? Authorizing Provider  cetirizine  (ZYRTEC ) 10 MG tablet TAKE 1 TABLET BY MOUTH EVERY DAY 05/11/24   Wendee Lynwood HERO, NP  EPINEPHrine 0.3 mg/0.3 mL IJ SOAJ injection INJECT 1 PEN IN THE MUSCLE ONE TIME AS DIRECTED PER INSTRUCTIONS IN PACK 11/22/21   [provider]  fluticasone (FLONASE) 50 MCG/ACT nasal spray SHAKE LIQUID AND USE 2 SPRAYS IN EACH NOSTRIL DAILY 11/21/21   [provider]  SUMAtriptan  (IMITREX ) 20 MG/ACT nasal spray Place 1 spray (20 mg total) into the nose every 2 (two) hours as needed for migraine or headache. May repeat in 2 hours if headache persists or recurs. 08/27/23   Copland, Jacques, MD  traZODone  (DESYREL ) 50 MG tablet TAKE 0.5-1 TABLETS BY MOUTH AT BEDTIME AS NEEDED FOR SLEEP. 05/11/24   Wendee Lynwood HERO, NP  venlafaxine  XR (EFFEXOR  XR) 37.5 MG 24 hr capsule Take 1 capsule (37.5 mg total) by mouth daily with breakfast. 05/13/24   Wendee Lynwood HERO, NP    Allergies: Bee venom    Review of Systems  Genitourinary:  Positive for flank pain.  All other systems reviewed and are negative.   Updated Vital Signs There were no vitals taken for this visit.  Physical Exam Vitals and nursing note reviewed.  Constitutional:      Appearance: Normal appearance.  HENT:      Head: Normocephalic and atraumatic.     Nose: Nose normal.     Mouth/Throat:     Mouth: Mucous membranes are moist.  Eyes:     Extraocular Movements: Extraocular movements intact.     Conjunctiva/sclera: Conjunctivae normal.     Pupils: Pupils are equal, round, and reactive to light.  Cardiovascular:     Rate and Rhythm: Normal rate and regular rhythm.     Pulses: Normal pulses.     Heart sounds: Normal heart sounds. No murmur heard.    No gallop.  Pulmonary:     Effort: Pulmonary effort is normal. No respiratory distress.     Breath sounds: Normal breath sounds. No stridor. No wheezing, rhonchi or rales.  Abdominal:     General: Abdomen is flat. Bowel sounds are normal. There is no distension.     Palpations: Abdomen is soft.     Tenderness: There is no guarding.     Comments: Tender to palpation over left side of abdomen and flank  Musculoskeletal:        General: Normal range of motion.     Cervical back: Normal range of motion and neck supple.  Skin:    General: Skin is warm and dry.  Neurological:     General: No focal deficit present.     Mental Status: He is alert and oriented to person,  place, and time. Mental status is at baseline.  Psychiatric:        Mood and Affect: Mood normal.        Behavior: Behavior normal.        Thought Content: Thought content normal.        Judgment: Judgment normal.     (all labs ordered are listed, but only abnormal results are displayed) Labs Reviewed  COMPREHENSIVE METABOLIC PANEL WITH GFR  LIPASE, BLOOD  CBC WITH DIFFERENTIAL/PLATELET  URINALYSIS, ROUTINE W REFLEX MICROSCOPIC    EKG: None  Radiology: DG Abd 2 Views Result Date: 06/09/2024 CLINICAL DATA:  Acute left-sided abdominal pain. EXAM: ABDOMEN - 2 VIEW COMPARISON:  January 18, 2022. FINDINGS: The bowel gas pattern is normal. There is no evidence of free air. No radio-opaque calculi or other significant radiographic abnormality is seen. IMPRESSION: Negative.  Electronically Signed   By: Lynwood Landy Raddle M.D.   On: 06/09/2024 11:44     Procedures   Medications Ordered in the ED  morphine  (PF) 4 MG/ML injection 4 mg (has no administration in time range)  ketorolac  (TORADOL ) 15 MG/ML injection 15 mg (has no administration in time range)  ondansetron  (ZOFRAN ) injection 4 mg (has no administration in time range)  sodium chloride  0.9 % bolus 1,000 mL (has no administration in time range)                                    Medical Decision Making Amount and/or Complexity of Data Reviewed Labs: ordered. Radiology: ordered.  Risk Prescription drug management.   This patient presents to the ED for concern of abdominal pain and flank pain differential diagnosis includes acute appendicitis, cholecystitis, bowel obstruction, diverticulitis, testicular torsion, pyonephritis, kidney stone, pancreatitis, mesenteric ischemia    Additional history obtained:  Additional history obtained from none External records from outside source obtained and reviewed including none   Lab Tests:  I Ordered, and personally interpreted labs.  The pertinent results include: Mild leukocytosis, no anemia, creatinine at baseline, normal electrolytes, normal liver function, negative lipase, unremarkable urinalysis   Imaging Studies ordered:  I ordered imaging studies including CT scan of abdomen and pelvis I independently visualized and interpreted imaging which showed Acute diverticulitis I agree with the radiologist interpretation   Medicines ordered and prescription drug management:  I ordered medication including Zofran , Toradol , morphine , IV fluids, Augmentin  for flank pain and acute diverticulitis Reevaluation of the patient after these medicines showed that the patient improved I have reviewed the patients home medicines and have made adjustments as needed   Problem List / ED Course:  Patient is doing well at this time and is stable for discharge  home.  Discussed with patient that CT scan of the abdomen and pelvis is consistent with acute diverticulitis.  Vital signs are stable with no indication for sepsis.  Blood work is overall been unremarkable except for mild leukocytosis.  Creatinine is at baseline.  He has no indication for kidney stone at this point.  Do not suspect that admission is warranted at this time.  Will cover with antibiotics as well as continued symptomatic treatment.  Did discuss with patient about the importance of close follow-up for colonoscopy once symptoms resolved.  He notes that his primary care doctor is already working on getting him set up for routine colonoscopy.  Strict turn precautions were discussed for any new or worsening symptoms.  Patient voiced  understanding and had no additional questions.   Social Determinants of Health:  None        Final diagnoses:  None    ED Discharge Orders     None          Daralene Lonni JONETTA DEVONNA 06/09/24 1736    Yolande Lamar BROCKS, MD 06/11/24 0030

## 2024-06-09 NOTE — ED Triage Notes (Signed)
 PT reports Lt sided ABD pain since yesterday.PT has not taken any OTC for pain. Pt reports he has had a kidney stone in the past.

## 2024-06-21 DIAGNOSIS — K5792 Diverticulitis of intestine, part unspecified, without perforation or abscess without bleeding: Secondary | ICD-10-CM

## 2024-06-21 HISTORY — DX: Diverticulitis of intestine, part unspecified, without perforation or abscess without bleeding: K57.92

## 2024-07-01 ENCOUNTER — Ambulatory Visit (INDEPENDENT_AMBULATORY_CARE_PROVIDER_SITE_OTHER): Admitting: Nurse Practitioner

## 2024-07-01 ENCOUNTER — Encounter: Payer: Self-pay | Admitting: Nurse Practitioner

## 2024-07-01 VITALS — BP 138/98 | HR 117 | Temp 97.8°F | Ht 73.5 in | Wt 276.8 lb

## 2024-07-01 DIAGNOSIS — G4709 Other insomnia: Secondary | ICD-10-CM

## 2024-07-01 DIAGNOSIS — Z09 Encounter for follow-up examination after completed treatment for conditions other than malignant neoplasm: Secondary | ICD-10-CM

## 2024-07-01 DIAGNOSIS — I10 Essential (primary) hypertension: Secondary | ICD-10-CM

## 2024-07-01 DIAGNOSIS — Z23 Encounter for immunization: Secondary | ICD-10-CM

## 2024-07-01 DIAGNOSIS — R3129 Other microscopic hematuria: Secondary | ICD-10-CM | POA: Diagnosis not present

## 2024-07-01 DIAGNOSIS — F411 Generalized anxiety disorder: Secondary | ICD-10-CM

## 2024-07-01 DIAGNOSIS — R7989 Other specified abnormal findings of blood chemistry: Secondary | ICD-10-CM | POA: Diagnosis not present

## 2024-07-01 LAB — URINALYSIS, MICROSCOPIC ONLY

## 2024-07-01 MED ORDER — VENLAFAXINE HCL ER 75 MG PO CP24: 75.0000 mg | ORAL_CAPSULE | Freq: Every day | ORAL | 1 refills | Status: DC

## 2024-07-01 MED ORDER — AMLODIPINE BESYLATE 5 MG PO TABS: 5.0000 mg | ORAL_TABLET | Freq: Every day | ORAL | 1 refills | Status: DC

## 2024-07-01 NOTE — Patient Instructions (Signed)
 Nice to see you today I have increased the Effexor  (venlafaxine ) to 75mg  you can take 2 capsules of the 37.5mg  until you finish what you have.   I have started you on some blood pressure medication. Let me know If you have any dizziness or lightheadedness.

## 2024-07-01 NOTE — Progress Notes (Signed)
 Established Patient Office Visit  Subjective   Patient ID: Seth Booker, male    DOB: 12/07/1975  Age: 48 y.o. MRN: [id number]  Chief Complaint  Patient presents with   Follow-up    MDD/GAD. Pt complains that his mood is getting better and states he is doing well on effexor .    HPI  Discussed the use of AI scribe software for clinical note transcription with the patient, who gave verbal consent to proceed.  History of Present Illness Seth Booker is a 48 year old male who presents for follow-up regarding mood and recent diverticulitis diagnosis.  He is on a low dose of Effexor  (venlafaxine ) for mood improvement, experiencing increased motivation and consistency with work attendance, though he occasionally calls in late. He takes the medication in the morning and is interested in increasing the dose. No side effects such as headaches, diarrhea, or nausea.  His sleep has improved but he still has difficulty falling and staying asleep. He occasionally uses trazodone , taking half a tablet based on the previous night's sleep quality, and 1 mg of melatonin, which he finds helpful without causing grogginess. His job requires waking up at 4 AM, affecting his sleep schedule. He previously used higher doses of melatonin when working third shift.  He recently had a bout of diverticulitis, confirmed by a CT scan after inconclusive x-rays at urgent care. Treated with antibiotics, he reports improvement but had a flare-up after eating pecan praline. He avoids foods like nuts and greasy foods to prevent exacerbation. He experienced diarrhea for three to four days during the flare-up but has returned to normal bowel movements. No fever or chills. Occasional nausea occurs, particularly after consuming dairy products late at night, suggesting possible intolerance.  He is scheduled to see a urologist and a pulmonologist soon and plans to follow up with a GI specialist.      07/01/2024    8:26 AM  04/15/2024    3:48 PM 08/27/2023    9:49 AM  PHQ9 SCORE ONLY  PHQ-9 Total Score 8 15  0      Data saved with a previous flowsheet row definition       07/01/2024    8:26 AM 04/15/2024    3:51 PM 07/25/2022   12:29 PM  GAD 7 : Generalized Anxiety Score  Nervous, Anxious, on Edge 1 3 3   Control/stop worrying 1 3 2   Worry too much - different things 1 3 2   Trouble relaxing 1 2 1   Restless 0 2 0  Easily annoyed or irritable 1 2 3   Afraid - awful might happen 0 1 0  Total GAD 7 Score 5 16 11   Anxiety Difficulty Somewhat difficult Somewhat difficult Somewhat difficult        Review of Systems  Constitutional:  Negative for chills and fever.  Respiratory:  Negative for shortness of breath.   Cardiovascular:  Negative for chest pain.  Gastrointestinal:  Negative for abdominal pain and diarrhea.  Psychiatric/Behavioral:  Negative for hallucinations and suicidal ideas.       Objective:     BP (!) 138/98   Pulse (!) 117   Temp 97.8 F (36.6 C) (Oral)   Ht 6' 1.5 (1.867 m)   Wt 276 lb 12.8 oz (125.6 kg)   SpO2 98%   BMI 36.02 kg/m  BP Readings from Last 3 Encounters:  07/01/24 (!) 138/98  06/09/24 (!) 145/77  06/09/24 (!) 142/91   Wt Readings from Last 3 Encounters:  07/01/24 276 lb 12.8 oz (125.6 kg)  05/13/24 267 lb 6.4 oz (121.3 kg)  04/15/24 282 lb 9.6 oz (128.2 kg)   SpO2 Readings from Last 3 Encounters:  07/01/24 98%  06/09/24 96%  05/13/24 98%      Physical Exam Vitals and nursing note reviewed.  Constitutional:      Appearance: Normal appearance.  Cardiovascular:     Rate and Rhythm: Normal rate and regular rhythm.     Heart sounds: Normal heart sounds.  Pulmonary:     Effort: Pulmonary effort is normal.     Breath sounds: Normal breath sounds.  Abdominal:     General: Bowel sounds are normal.     Tenderness: There is no abdominal tenderness.  Neurological:     Mental Status: He is alert.      No results found for any visits on  07/01/24.    The ASCVD Risk score (Arnett DK, et al., 2019) failed to calculate for the following reasons:   The valid total cholesterol range is 130 to 320 mg/dL    Assessment & Plan:   Problem List Items Addressed This Visit       Other   Anxiety, generalized - Primary   Relevant Medications   venlafaxine  XR (EFFEXOR  XR) 75 MG 24 hr capsule   Other insomnia   Low testosterone    Other Visit Diagnoses       Microscopic hematuria       Relevant Orders   Urine Microscopic     Primary hypertension       Relevant Medications   amLODipine  (NORVASC ) 5 MG tablet     Need for influenza vaccination       Relevant Orders   Flu vaccine trivalent PF, 6mos and older(Flulaval,Afluria,Fluarix,Fluzone) (Completed)     Hospital discharge follow-up          Assessment and Plan Assessment & Plan Adjustment disorder with mixed anxiety and depressed mood Started on venlafaxine  37.5 mg with improved mood and motivation, no side effects. Interested in dose increase for better efficacy. - Increase venlafaxine  to 75 mg daily. Instruct to take two 37.5 mg tablets until new prescription is filled. - Schedule follow-up in three months to assess response and mental health.  Insomnia Improved sleep but difficulty staying asleep. Trazodone  causes grogginess if taken late. Melatonin helps without grogginess. - Continue trazodone  25 to 50 mg as needed for sleep onset. - Use 1 mg melatonin as needed for middle-of-the-night awakenings. - Advise on sleep hygiene and medication timing to avoid grogginess.  Diverticulitis of colon, resolved Recent diverticulitis episode resolved post-antibiotics. Dietary triggers identified. Asymptomatic currently. - Advise dietary modifications to avoid nuts, seeds, and irritants. - Instruct clear liquid diet during flare-ups, reintroduce solids gradually. - Ensure gastroenterology follow-up for colonoscopy once healed. -reviewed ed notes, labs and CT scan    Microscopic hematuria, under evaluation Microscopic hematuria noted, tobacco use may contribute. Urology evaluation scheduled. - Recheck urine for hematuria. - Follow up with urology for further evaluation and management.  General Health Maintenance Due for flu shot, agreed to receive it. - Administer flu shot during this visit.  Follow-Up Upcoming urology and pulmonology appointments. Advised to ensure communication of findings to primary care. - Ensure follow-up with urology and pulmonology as scheduled. - Schedule follow-up in three months to reassess conditions and review specialist reports.  Return in about 3 months (around 09/30/2024) for BP recheck/mood.    Adina Crandall, NP

## 2024-07-02 ENCOUNTER — Ambulatory Visit: Payer: Self-pay | Admitting: Nurse Practitioner

## 2024-07-06 ENCOUNTER — Telehealth: Payer: Self-pay

## 2024-07-06 ENCOUNTER — Ambulatory Visit: Admitting: Nurse Practitioner

## 2024-07-06 NOTE — Telephone Encounter (Signed)
 Tried calling the patient per Katie's request to see if patient can come into office earlier today than originally scheduled for 3:30.  I am routing to front staff to see if you can get patient scheduled earlier at 1:30 today with KC instead of 3:30  Please advise

## 2024-07-06 NOTE — Telephone Encounter (Signed)
 Attempted to call patient. Left voicemail for him to come in earlier.

## 2024-07-29 ENCOUNTER — Ambulatory Visit (AMBULATORY_SURGERY_CENTER)

## 2024-07-29 VITALS — Ht 73.5 in | Wt 272.0 lb

## 2024-07-29 DIAGNOSIS — Z1211 Encounter for screening for malignant neoplasm of colon: Secondary | ICD-10-CM

## 2024-07-29 MED ORDER — NA SULFATE-K SULFATE-MG SULF 17.5-3.13-1.6 GM/177ML PO SOLN: 1.0000 | Freq: Once | ORAL | 0 refills | Status: AC

## 2024-07-29 NOTE — Progress Notes (Signed)
 Pre visit completed in person; Patient verified name, DOB, and address; No egg or soy allergy known to patient;  No issues known to pt with past sedation with any surgeries or procedures; Patient denies ever being told they had issues or difficulty with intubation;  No FH of Malignant Hyperthermia; Pt is not on diet pills; Pt is not on home 02;  Pt is not on blood thinners;  Pt denies issues with constipation;  No A fib or A flutter; Have any cardiac testing pending--NO Insurance verified during PV appt--- UHC Pt can ambulate without assistance;  Pt denies use of chewing tobacco; Discussed diabetic/weight loss medication holds; Discussed NSAID holds; Checked BMI to be less than 50; Pt instructed to use Single[employer] or GoodRx for a price reduction on prep; [facility] goodrx coupon given to patient at time of PV; Patient's chart reviewed by Norleen Schillings CNRA prior to previsit and patient appropriate for the [facility]; Pre visit completed and red dot placed by patient's name on their procedure day (on provider's schedule);   Instructions sent to MyChart as well as printed and given to patient at time of PV appt;

## 2024-08-02 NOTE — Progress Notes (Signed)
 08/03/24- 48 yoM for sleep evaluation courtesy of Lynwood Crandall, NP with concern of OSA, pending Inspire Medical problems include Migraine, Allergic Rhinitis, DDD, Anxiety/Depression Epworth score-13 Body weight today-272 lbs Consult - Sleep apnea.  Would like to consider Inspire.   Had used CPAP about 12 years, didn't like it and quit 4 years ago. He refers in-center sleep study to update. Discussed the use of AI scribe software for clinical note transcription with the patient, who gave verbal consent to proceed.  History of Present Illness   Seth Booker is a 48 year old male with sleep apnea who presents with issues related to CPAP non-compliance and sleep disturbances.  He has not used CPAP therapy for about four years due to erratic work hours, including shifts from midnight to noon and four in the morning to four in the afternoon. UPPP surgery was unsuccessful in resolving his sleep apnea symptoms. He experiences fatigue, which worsens with weight gain, particularly when his weight is between 285 to 300 pounds. He feels better when maintaining a weight of 260 to 270 pounds. He has undergone sleep studies three times and prefers in-center overnight studies over home tests. He uses smokeless tobacco, consuming about a can a day, and acknowledges the need to quit.     Past Medical History:  Diagnosis Date   Anxiety    DDD (degenerative disc disease), cervical    Diverticulitis 06/21/2024   flare-tx'd with medications   History of drug abuse (HCC)    Migraine    Sleep apnea    no CPAP   Past Surgical History:  Procedure Laterality Date   ADENOIDECTOMY     CYSTOSCOPY W/ STONE MANIPULATION     x 3   ELBOW FRACTURE SURGERY  10/2023   KNEE SURGERY Left    x 2 arthroscopic   NASAL SEPTUM SURGERY     NECK SURGERY     2 disc fused and titanium cage put in, C4 and C5   TONSILLECTOMY     URETHRA SURGERY     Family History  Problem Relation Age of Onset   Breast cancer Mother  60       relapse at age 76   Thyroid  cancer Mother 69   COPD Father    Emphysema Father    Juvenile Diabetes Sister    Colon polyps Neg Hx    Colon cancer Neg Hx    Esophageal cancer Neg Hx    Stomach cancer Neg Hx    Rectal cancer Neg Hx    Social History   Socioeconomic History   Marital status: Legally Separated    Spouse name: Not on file   Number of children: 2   Years of education: Not on file   Highest education level: Associate degree: occupational, Scientist, product/process development, or vocational program  Occupational History   Not on file  Tobacco Use   Smoking status: Never   Smokeless tobacco: Current    Types: Chew, Snuff   Tobacco comments:    Dip tobacco use-1 can a day  Vaping Use   Vaping status: Never Used  Substance and Sexual Activity   Alcohol use: Yes    Alcohol/week: 15.0 standard drinks of alcohol    Types: 15 Standard drinks or equivalent per week    Comment: 3 to 5 drinks a week-on the weekend usually   Drug use: Not Currently    Types: GHB, Cocaine, Fentanyl, Heroin, MDMA (Ecstacy), Ketamine    Comment: sober since 2018  Sexual activity: Not Currently  Other Topics Concern   Not on file  Social History Narrative   Kegan  8 riley 41      ITG: fulltime cigarette company    Social Drivers of Corporate investment banker Strain: Low Risk  (04/14/2024)   Overall Financial Resource Strain (CARDIA)    Difficulty of Paying Living Expenses: Not very hard  Food Insecurity: Food Insecurity Present (04/14/2024)   Hunger Vital Sign    Worried About Running Out of Food in the Last Year: Never true    Ran Out of Food in the Last Year: Sometimes true  Transportation Needs: No Transportation Needs (04/14/2024)   PRAPARE - Administrator, Civil Service (Medical): No    Lack of Transportation (Non-Medical): No  Physical Activity: Inactive (04/14/2024)   Exercise Vital Sign    Days of Exercise per Week: 0 days    Minutes of Exercise per Session: Not on file   Stress: Stress Concern Present (04/14/2024)   [employer] of Occupational Health - Occupational Stress Questionnaire    Feeling of Stress: Rather much  Social Connections: Moderately Integrated (04/14/2024)   Social Connection and Isolation Panel    Frequency of Communication with Friends and Family: More than three times a week    Frequency of Social Gatherings with Friends and Family: Twice a week    Attends Religious Services: More than 4 times per year    Active Member of [employer] or Organizations: Yes    Attends Engineer, structural: More than 4 times per year    Marital Status: Separated  Intimate Partner Violence: Not on file   Assessment and Plan:    Obstructive sleep apnea Chronic obstructive sleep apnea with previous CPAP use discontinued. Daytime sleepiness improved with weight loss. Previous UPPP surgery ineffective. Discussed Inspire therapy, noting its lack of long-term data. Explained surgical procedure, potential benefits, and risks. He is interested in exploring Inspire therapy further. - Order updated sleep study, preferably in-center overnight. - Refer to ENT specialist for [facility] therapy consultation if appropriate after this sleep study.  Obesity Weight loss improved sleep apnea symptoms. No current lung or heart conditions. Uses smokeless tobacco, considering cessation. - Encourage weight management to maintain or reduce weight. - Advise on tobacco cessation, possibly using nicotine patches.       ROS-see HPI   + = positive Constitutional:    weight loss, night sweats, fevers, chills, fatigue, lassitude. HEENT:    headaches, difficulty swallowing, tooth/dental problems, sore throat,       sneezing, itching, ear ache, nasal congestion, post nasal drip, snoring CV:    chest pain, orthopnea, PND, swelling in lower extremities, anasarca,                                   dizziness, palpitations Resp:   shortness of breath with exertion or at rest.                 productive cough,   non-productive cough, coughing up of blood.              change in color of mucus.  wheezing.   Skin:    rash or lesions. GI:  No-   heartburn, indigestion, abdominal pain, nausea, vomiting, diarrhea,                 change in bowel habits, loss of appetite  GU: dysuria, change in color of urine, no urgency or frequency.   flank pain. MS:   joint pain, stiffness, decreased range of motion, back pain. Neuro-     nothing unusual Psych:  change in mood or affect.  depression or anxiety.   memory loss.  OBJ- Physical Exam General- Alert, Oriented, Affect-appropriate, Distress- none acute, +overweight Skin- rash-none, lesions- none, excoriation- none Lymphadenopathy- none Head- atraumatic            Eyes- Gross vision intact, PERRLA, conjunctivae and secretions clear            Ears- Hearing, canals-normal            Nose- Clear, no-Septal dev, mucus, polyps, erosion, perforation             Throat- Mallampati IV/ UPPP , mucosa clear , drainage- none, tonsils- atrophic, +teeth Neck- flexible , trachea midline, no stridor , thyroid  nl, carotid no bruit Chest - symmetrical excursion , unlabored           Heart/CV- RRR , no murmur , no gallop  , no rub, nl s1 s2                           - JVD- none , edema- none, stasis changes- none, varices- none           Lung- clear to P&A, wheeze- none, cough- none , dullness-none, rub- none           Chest wall-  Abd-  Br/ Gen/ Rectal- Not done, not indicated Extrem- cyanosis- none, clubbing, none, atrophy- none, strength- nl Neuro- grossly intact to observation

## 2024-08-03 ENCOUNTER — Encounter: Payer: Self-pay | Admitting: Internal Medicine

## 2024-08-03 ENCOUNTER — Ambulatory Visit: Admitting: Internal Medicine

## 2024-08-03 VITALS — BP 140/84 | HR 101 | Temp 98.0°F | Ht 74.0 in | Wt 272.6 lb

## 2024-08-03 DIAGNOSIS — E669 Obesity, unspecified: Secondary | ICD-10-CM

## 2024-08-03 DIAGNOSIS — G4733 Obstructive sleep apnea (adult) (pediatric): Secondary | ICD-10-CM

## 2024-08-03 DIAGNOSIS — F17220 Nicotine dependence, chewing tobacco, uncomplicated: Secondary | ICD-10-CM

## 2024-08-03 NOTE — Patient Instructions (Signed)
 Order- schedule split night sleep study   dx OSA  Please call us  for results about 2 weeks after your test. As discussed, I expect to refer you to ENT to discuss Inspire once we have that result.

## 2024-08-06 ENCOUNTER — Encounter: Payer: Self-pay | Admitting: Internal Medicine

## 2024-08-11 ENCOUNTER — Ambulatory Visit (AMBULATORY_SURGERY_CENTER): Admitting: Internal Medicine

## 2024-08-11 ENCOUNTER — Encounter: Payer: Self-pay | Admitting: Internal Medicine

## 2024-08-11 VITALS — BP 109/67 | HR 84 | Temp 97.3°F | Resp 18 | Ht 73.5 in | Wt 272.0 lb

## 2024-08-11 DIAGNOSIS — K5730 Diverticulosis of large intestine without perforation or abscess without bleeding: Secondary | ICD-10-CM | POA: Diagnosis not present

## 2024-08-11 DIAGNOSIS — D122 Benign neoplasm of ascending colon: Secondary | ICD-10-CM | POA: Diagnosis not present

## 2024-08-11 DIAGNOSIS — Z1211 Encounter for screening for malignant neoplasm of colon: Secondary | ICD-10-CM | POA: Diagnosis present

## 2024-08-11 DIAGNOSIS — D125 Benign neoplasm of sigmoid colon: Secondary | ICD-10-CM

## 2024-08-11 DIAGNOSIS — D128 Benign neoplasm of rectum: Secondary | ICD-10-CM

## 2024-08-11 MED ORDER — SODIUM CHLORIDE 0.9 % IV SOLN: 500.0000 mL | INTRAVENOUS | Status: DC

## 2024-08-11 NOTE — Progress Notes (Signed)
 HISTORY OF PRESENT ILLNESS:  Seth Booker is a 48 y.o. male is sent directly for screening colonoscopy.  REVIEW OF SYSTEMS:  All non-GI ROS negative except for  Past Medical History:  Diagnosis Date   Anxiety    DDD (degenerative disc disease), cervical    Diverticulitis 06/21/2024   flare-tx'd with medications   History of drug abuse (HCC)    Migraine    Sleep apnea    no CPAP    Past Surgical History:  Procedure Laterality Date   ADENOIDECTOMY     CYSTOSCOPY W/ STONE MANIPULATION     x 3   ELBOW FRACTURE SURGERY  10/2023   KNEE SURGERY Left    x 2 arthroscopic   NASAL SEPTUM SURGERY     NECK SURGERY     2 disc fused and titanium cage put in, C4 and C5   TONSILLECTOMY     URETHRA SURGERY      Social History CAPRI RABEN  reports that he has never smoked. His smokeless tobacco use includes chew and snuff. He reports current alcohol use of about 15.0 standard drinks of alcohol per week. He reports that he does not currently use drugs after having used the following drugs: GHB, Cocaine, Fentanyl, Heroin, MDMA (Ecstacy), and Ketamine.  family history includes Breast cancer (age of onset: 27) in his mother; COPD in his father; Emphysema in his father; Juvenile Diabetes in his sister; Thyroid  cancer (age of onset: 77) in his mother.  Allergies  Allergen Reactions   Bee Venom Anaphylaxis       PHYSICAL EXAMINATION: Vital signs: BP (!) 143/83   Pulse 96   Temp (!) 97.3 F (36.3 C) (Temporal)   Ht 6' 1.5 (1.867 m)   Wt 272 lb (123.4 kg)   SpO2 98%   BMI 35.40 kg/m  General: Well-developed, well-nourished, no acute distress HEENT: Sclerae are anicteric, conjunctiva pink. Oral mucosa intact Lungs: Clear Heart: Regular Abdomen: soft, nontender, nondistended, no obvious ascites, no peritoneal signs, normal bowel sounds. No organomegaly. Extremities: No edema Psychiatric: alert and oriented x3. Cooperative     ASSESSMENT: Cancer  screening    PLAN:  Screening colonoscopy

## 2024-08-11 NOTE — Op Note (Signed)
 [facility] Patient Name: Seth Booker Procedure Date: 08/11/2024 8:51 AM MRN: [id number] Endoscopist: Norleen SAILOR. Abran , MD, [id number] Age: 48 Referring MD:  Date of Birth: [birth year] Gender: Male Account #: [id number] Procedure:                Colonoscopy with cold snare polypectomy x 3; hot                            snare x 1; submucosal injection Indications:              Screening for colorectal malignant neoplasm Medicines:                Monitored Anesthesia Care Procedure:                Pre-Anesthesia Assessment:                           - Prior to the procedure, a History and Physical                            was performed, and patient medications and                            allergies were reviewed. The patient's tolerance of                            previous anesthesia was also reviewed. The risks                            and benefits of the procedure and the sedation                            options and risks were discussed with the patient.                            All questions were answered, and informed consent                            was obtained. Prior Anticoagulants: The patient has                            taken no anticoagulant or antiplatelet agents. ASA                            Grade Assessment: II - A patient with mild systemic                            disease. After reviewing the risks and benefits,                            the patient was deemed in satisfactory condition to                            undergo the procedure.  After obtaining informed consent, the colonoscope                            was passed under direct vision. Throughout the                            procedure, the patient's blood pressure, pulse, and                            oxygen saturations were monitored continuously. The                            Olympus Scope SN [id number] was introduced through the                             anus and advanced to the the cecum, identified by                            appendiceal orifice and ileocecal valve. The                            ileocecal valve, appendiceal orifice, and rectum                            were photographed. The quality of the bowel                            preparation was excellent. The colonoscopy was                            performed without difficulty. The patient tolerated                            the procedure well. The bowel preparation used was                            SUPREP via split dose instruction. Scope In: 8:57:53 AM Scope Out: 9:17:08 AM Scope Withdrawal Time: 0 hours 16 minutes 4 seconds  Total Procedure Duration: 0 hours 19 minutes 15 seconds  Findings:                 A 15 mm polyp was found in the sigmoid colon. The                            polyp was pedunculated. The polyp was removed with                            a hot snare. Resection and retrieval were complete.                            The post polypectomy site was tattooed just                            downstream  from the lesion                           Three polyps were found in the rectum and ascending                            colon. The polyps were 1 to 6 mm in size. These                            polyps were removed with a cold snare. Resection                            and retrieval were complete.                           Multiple diverticula were found in the left colon                            and right colon.                           The exam was otherwise without abnormality on                            direct and retroflexion views. Complications:            No immediate complications. Estimated blood loss:                            None. Estimated Blood Loss:     Estimated blood loss: none. Impression:               - One 15 mm polyp in the sigmoid colon, removed                            with a hot snare. Resected and retrieved.  Tattooed.                           - Three 1 to 6 mm polyps in the rectum and in the                            ascending colon, removed with a cold snare.                            Resected and retrieved.                           - Diverticulosis in the left colon and in the right                            colon.                           - The examination was otherwise normal on direct  and retroflexion views. Recommendation:           - Repeat colonoscopy in 3 years for surveillance.                           - Patient has a contact number available for                            emergencies. The signs and symptoms of potential                            delayed complications were discussed with the                            patient. Return to normal activities tomorrow.                            Written discharge instructions were provided to the                            patient.                           - Resume previous diet.                           - Continue present medications.                           - Await pathology results. Norleen SAILOR. Abran, MD 08/11/2024 9:25:35 AM This report has been signed electronically.

## 2024-08-11 NOTE — Patient Instructions (Signed)
-  Handout on polyp, diverticulosis provided. -await pathology results. -repeat colonoscopy in 3 years for surveillance recommended. -Continue present medications.  YOU HAD AN ENDOSCOPIC PROCEDURE TODAY AT THE [facility]:   Refer to the procedure report that was given to you for any specific questions about what was found during the examination.  If the procedure report does not answer your questions, please call your gastroenterologist to clarify.  If you requested that your care partner not be given the details of your procedure findings, then the procedure report has been included in a sealed envelope for you to review at your convenience later.  YOU SHOULD EXPECT: Some feelings of bloating in the abdomen. Passage of more gas than usual.  Walking can help get rid of the air that was put into your GI tract during the procedure and reduce the bloating. If you had a lower endoscopy (such as a colonoscopy or flexible sigmoidoscopy) you may notice spotting of blood in your stool or on the toilet paper. If you underwent a bowel prep for your procedure, you may not have a normal bowel movement for a few days.  Please Note:  You might notice some irritation and congestion in your nose or some drainage.  This is from the oxygen used during your procedure.  There is no need for concern and it should clear up in a day or so.  SYMPTOMS TO REPORT IMMEDIATELY:  Following lower endoscopy (colonoscopy or flexible sigmoidoscopy):  Excessive amounts of blood in the stool  Significant tenderness or worsening of abdominal pains  Swelling of the abdomen that is new, acute  Fever of 100F or higher   For urgent or emergent issues, a gastroenterologist can be reached at any hour by calling [phone]. Do not use MyChart messaging for urgent concerns.    DIET:  We do recommend a small meal at first, but then you may proceed to your regular diet.  Drink plenty of fluids but you should avoid  alcoholic beverages for 24 hours.  ACTIVITY:  You should plan to take it easy for the rest of today and you should NOT DRIVE or use heavy machinery until tomorrow (because of the sedation medicines used during the test).    FOLLOW UP: Our staff will call the number listed on your records the next business day following your procedure.  We will call around 7:15- 8:00 am to check on you and address any questions or concerns that you may have regarding the information given to you following your procedure. If we do not reach you, we will leave a message.     If any biopsies were taken you will be contacted by phone or by letter within the next 1-3 weeks.  Please call us  at [phone] if you have not heard about the biopsies in 3 weeks.    SIGNATURES/CONFIDENTIALITY: You and/or your care partner have signed paperwork which will be entered into your electronic medical record.  These signatures attest to the fact that that the information above on your After Visit Summary has been reviewed and is understood.  Full responsibility of the confidentiality of this discharge information lies with you and/or your care-partner.

## 2024-08-11 NOTE — Progress Notes (Signed)
 Called to room to assist during endoscopic procedure.  Patient ID and intended procedure confirmed with present staff. Received instructions for my participation in the procedure from the performing physician.

## 2024-08-11 NOTE — Progress Notes (Signed)
 Pt's states no medical or surgical changes since previsit or office visit.

## 2024-08-11 NOTE — Progress Notes (Signed)
 Report to PACU, RN, vss, BBS= Clear.

## 2024-08-12 ENCOUNTER — Telehealth: Payer: Self-pay | Admitting: *Deleted

## 2024-08-12 NOTE — Telephone Encounter (Signed)
 Unable to leave VM for post op call.

## 2024-08-16 LAB — SURGICAL PATHOLOGY

## 2024-08-18 ENCOUNTER — Ambulatory Visit: Payer: Self-pay | Admitting: Internal Medicine

## 2024-08-18 NOTE — ED Triage Notes (Signed)
 Pt coming in with c/o sudden onset rectal bleeding. Pt had a colonoscopy last week and had 5 polyps removed. Pt states he has had multiple episode of bleeding the past couple hours.

## 2024-08-19 ENCOUNTER — Inpatient Hospital Stay: Admit: 2024-08-19

## 2024-08-19 ENCOUNTER — Telehealth: Payer: Self-pay | Admitting: Internal Medicine

## 2024-08-19 ENCOUNTER — Encounter (HOSPITAL_COMMUNITY): Payer: Self-pay

## 2024-08-19 NOTE — Telephone Encounter (Signed)
 Colonoscopy w/ hot snare polypectomy 10/22 15 mm sigmoid polyp  Had sudden onset hematochezia today after liftinf suitcase - out of town in [place]  In ED Hgb 13.5  then 12.8  BP Normal, mildly tachycardic  Hospital does not have GI coverage  Will be transferred to [facility] for care

## 2024-08-19 NOTE — Telephone Encounter (Signed)
 Noted. Thanks.

## 2024-08-20 ENCOUNTER — Telehealth: Payer: Self-pay | Admitting: Nurse Practitioner

## 2024-08-20 NOTE — Telephone Encounter (Signed)
 Left message for pt to call back

## 2024-08-20 NOTE — Telephone Encounter (Signed)
 Seth Booker, patient underwent a colonoscopy with Dr. Abran on which showed the following: - One 15 mm polyp in the sigmoid colon, removed with a hot snare. Resected and retrieved. Tattooed. - Three 1 to 6 mm polyps in the rectum and in the ascending colon, removed with a cold snare. Resected and retrieved. - Diverticulosis in the left colon and in the right colon. - The examination was otherwise normal on direct and retroflexion views.  He presented to [facility] ED on 10/29 with suspected post polypectomy bleed. Per phone not by Dr. Avram, patient was to be transferred to [facility] or [facility] which did not occur.  Please contact patient and obtain an update, verify if he continues to have any blood per the rectum at this time. Send him to our lab on Monday 11/3 to repeat a CBC.   Dr. Abran LIPS

## 2024-08-20 NOTE — Telephone Encounter (Signed)
 Noted. Thanks for the update.

## 2024-08-20 NOTE — Discharge Summary (Signed)
 ------------------------------------------------------------------------------- Attestation signed by Char Louder, MD at 08/20/2024 12:04 PM Time spent on discharge 40 min . I personally have seen and examined the patient face-to-face today.  I have reviewed the history and MDM components completed by PA Steamer and agree with their findings.  I have performed the substantive part of the medical decision making (MDM).  Assessment and Plan of care noted below.  48 y.o. male with past medical history significant for obesity, obstructive sleep apnea, recent diverticulitis who underwent colonoscopy with 5 polypectomies during colon cancer screening 1 week prior who presented with hematochezia and admitted with acute blood loss anemia.  Patient also admitted to frequent BC/Goody Powder use.  Patient underwent colonoscopy on 08/19/2024 with cauterization and 2 hemoclips placement for achievement of hemostasis of a bleeding vessel.  Patient monitored overnight and H/H remained stable.  Medically stable for discharge on PPI.  Counseled patient extensively on discontinuing use of NSAIDs.    Vital Signs for the last 24 hours:  Temperature:  [36.1 C (97 F)-36.9 C (98.4 F)] 36.4 C (97.5 F) Heart Rate:  [92-122] 92 Resp:  [13-30] 19 BP: (109-147)/(65-107) 137/76 Weight: 123 kg  Intake/Output Summary (Last 24 hours) at 08/20/2024 1202 Last data filed at 08/20/2024 1000 Gross per 24 hour  Intake 1303.33 ml  Output --  Net 1303.33 ml    Electronically signed by: Char Louder, MD 08/20/2024 12:02 PM     -------------------------------------------------------------------------------  Patient Name: Seth Booker MRN: [id number]  Date of Birth: [birth year] PCP: No primary care provider on file.    Obs date: 08/19/2024 1656  Admit Date: 08/18/2024 Discharge Date: 08/20/24  Discharge Diagnosis   Principal Problem:   Acute blood loss anemia (POA: Unknown) Active Problems:    Lower GI bleed (POA: Yes)   Alcohol dependence (CMS/HCC) (POA: Unknown)   Essential hypertension (POA: Unknown) Resolved Problems:   * No resolved hospital problems. [facility] Course   The patient is felt to have met maximal medical benefit from hospitalization and will be discharged in stable condition.  Hospital problems and treatment course are outlined below.   Patient is a 48 y.o. male with past medical history significant for obesity, obstructive sleep apnea, recent diverticulitis who underwent colonoscopy and had 5 polypectomies done in [facility] on October 22nd by Dr. Norleen Kiang for the purpose of colon cancer screening.  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  Reportedly patient was doing well until he developed sudden onset rectal bleeding and he presented to [facility] ED. his stool guaiac was positive.  His hemoglobin and hematocrit was down trending and initially it was 13.5 and trended down to 11.1 on repeat evaluation.  Patient denied any use of aspirin or Plavix or any anticoagulation.  He was treated with IV fluid hydration and a CTA of abdomen and pelvis was done which did not show any active bleeding but he continue to have bloody bowel movement while waiting in the ED. initially there was attempts made to transfer him to [facility] as he had his initial procedure done there.  Patient was accepted but they did not have any open bed and he continued to have active bleeding so later on patient was rather transferred to [facility] for evaluation by GI here.  Patient was taken to the endoscopy suite as soon as he arrived Dr Dann.  He had diverticulosis in the sigmoid colon, descending and transverse colon.  Blood in the entire colon.  Actively bleeding polypectomy sitetreated with epinephrine, BICAP, clips, and Purastat.  He was placed on a clear liquid diet, advanced without difficulty.  Follow-up hemoglobin 9.0.  Patient  hemodynamically stable for discharge home with PCP follow-up. He is going to obtain a Neurology follow-up from his PCP to address his chronic headaches.  Advised to stop NSAID usage.  Discharge Exam and Labs  Subjective/Objective Vital Signs for the last 24 hours: Temperature:  [36.1 C (97 F)-36.9 C (98.4 F)] 36.4 C (97.5 F) Heart Rate:  [94-122] 109 Resp:  [13-30] 19 BP: (109-147)/(65-107) 137/76  Physical Exam: General: no acute distress  Abdomen: Soft, Nontender, Nondistended, +bs  HEENT:   Musculoskeletal:    FROM   Skin: No rash noted  Chest: Clear to Auscultation Psychiatric:mood and affect appropriate  Cardiac: Regular Rate & Rhythm, No edema Neurologic:  no gross neurological deficits   Labs: Results from last 7 days  Lab Units 08/20/24 0435 08/19/24 1156 08/19/24 0600 08/19/24 0000 08/18/24 2010  WBC, AUTOMATED 10*3/uL 7.76  --  11.16*  --  12.64*  HEMOGLOBIN g/dL 9.0*   < > 88.2*   < > 13.5*  HEMATOCRIT % 26.5*   < > 32.9*   < > 37.1*  PLATELETS 10*3/uL 207  --  230  --  272   < > = values in this interval not displayed.    Results from last 7 days  Lab Units 08/20/24 0435 08/19/24 0600 08/18/24 2010  SODIUM mmol/L 138 139 140  POTASSIUM mmol/L 4.1 4.0 3.7  CHLORIDE mmol/L 110* 107 107  CO2 mmol/L 24 21* 20*  BUN mg/dl 8 9 12   CREATININE mg/dL 8.92 8.91 8.97  CALCIUM mg/dl 8.0* 8.4* 9.0  PHOSPHORUS mg/dl 2.9 2.3* 3.0  ALBUMIN g/dL 3.5 3.9 4.5  4.5  TOTAL BILIRUBIN mg/dl  --   --  0.4  ALKALINE PHOSPHATASE U/L  --   --  74  ALT (SGPT) U/L  --   --  50  AST U/L  --   --  36   Results from last 7 days  Lab Units 08/20/24 0435 08/19/24 0600 08/18/24 2010  GLUCOSE mg/dL 895 894 98       Results from last 7 days  Lab Units 08/20/24 0435 08/18/24 2010  MAGNESIUM mg/dl 2.1 2.2       CTA Abdomen Pelvis With and Without Contrast  Final Result  No acute process in the abdomen and pelvis, specifically no evidence of active GI bleed.   Plan  of care discussed with patient.  All questions answered.  They verbalize understanding of plan of care.   Condition: good Disposition: Home  Risk of Unplanned Readmission at Discharge (No score for Observation patients) Predictive Model Details       12% (Low) Factor Value   Calculated 08/20/2024 08:04 Number of active inpatient medication orders 28   Risk of Unplanned Readmission Model ECG/EKG order present in last 6 months    Latest calcium low (8.0 mg/dl)    Imaging order present in last 6 months    Latest hemoglobin low (9.0 g/dL)    Phosphorous result present    Number of ED visits in last six months 1    Age 89    Active ulcer inpatient medication order present      Discharge Medications and Instructions   Discharge meds for DC to Home/SNF/Outside facility        Medication Information  TAKE these medications    amLODIPine  5 mg tablet Commonly known as: NORVASC  Take 1 tablet (5 mg total) by mouth daily (at the same time every day).   cetirizine  10 mg tablet Commonly known as: ZyrTEC  Take 1 tablet (10 mg total) by mouth daily in the morning.   cholecalciferol 1,000 units tablet Take 1 tablet (1,000 Units total) by mouth daily in the morning.   ondansetron  4 mg disintegrating tablet Commonly known as: ZOFRAN  ODT Take 1 tablet (4 mg total) by mouth every 8 (eight) hours as needed for nausea.   SUMAtriptan  20 mg/actuation nasal spray Commonly known as: IMITREX  Please see attached for detailed directions   traZODone  50 mg tablet Commonly known as: DESYREL  Take one-half to one tablet by mouth at bedtime as needed for sleep   venlafaxine  75 mg 24 hr capsule Commonly known as: EFFEXOR  XR Take 1 capsule (75 mg total) by mouth daily in the morning.        Activity Instructions             Up as tolerated                  Diet Instructions             Discharge Diet       Question Answer Comment  Diet type Cardiac   Sodium Restriction: No  Added Salt                Discharge Lab Orders             CBC                   Follow Up   Referrals     Primary Care Provider Post-Hospitalization Follow-Up     Family Medicine                   Electronically signed by: Stefanie JONELLE Brigido CARIN, MPAC 08/20/2024 11:30 AM

## 2024-08-23 ENCOUNTER — Telehealth: Payer: Self-pay

## 2024-08-23 NOTE — Telephone Encounter (Signed)
 Called and spoke with Lt. Snell, he reported that medical examiner was involved last night, appeared to be no fowl play, no autopsy was recommended.

## 2024-08-23 NOTE — Telephone Encounter (Signed)
 Can we find out the the medical examiner is involved or if the family is going to want a autopsy?

## 2024-08-23 NOTE — Telephone Encounter (Signed)
 Pt chart was changed to deceased status and sent to J. Cable NP. [employer].

## 2024-08-23 NOTE — Telephone Encounter (Signed)
 SABRA

## 2024-08-23 NOTE — Telephone Encounter (Signed)
 Dr Jaclyn-  An FYI on this patient. Seth Booker received notice yesterday from [facility] that patient is deceased.

## 2024-08-23 NOTE — Telephone Encounter (Signed)
 Chart reviewed. It appears that he went to [facility] for post polypectomy bleeding.  He underwent colonoscopy with successful hemostatic therapy.  He was deemed stable and discharged home.  No further details.

## 2024-08-24 ENCOUNTER — Telehealth: Payer: Self-pay | Admitting: Nurse Practitioner

## 2024-08-24 ENCOUNTER — Encounter: Admitting: Nurse Practitioner

## 2024-08-24 NOTE — Telephone Encounter (Signed)
 Copied from CRM [id number]. Topic: General - Deceased Patient >> 09-01-2024  5:03 PM Nessti S wrote: Name of caller: steven grace  Date of death: 08-31-2024   Name of funeral home: [facility] [employer] [facility]  Phone number of funeral home: [phone]  Provider that needs to sign form: james cable  Timeline for signing: soon as possible

## 2024-08-25 ENCOUNTER — Other Ambulatory Visit: Payer: Self-pay | Admitting: Family Medicine

## 2024-08-25 NOTE — Telephone Encounter (Signed)
 Tried to locate the death certificate on [place] and could not. Can we have the funeral home send it to me please

## 2024-08-25 NOTE — Telephone Encounter (Signed)
 Closing note. Matt spoke with medical examiner.

## 2024-08-25 NOTE — Telephone Encounter (Signed)
 Called and spoke with Seth Booker and got the certificate assigned to me. He mentioned that they did send the body off for autopsy

## 2024-08-26 ENCOUNTER — Telehealth: Payer: Self-pay | Admitting: Nurse Practitioner

## 2024-08-26 NOTE — Telephone Encounter (Signed)
 Copied from CRM #[id number]. Topic: General - Deceased Patient >> 09-03-2024  9:47 AM Charlet HERO wrote: Name of caller: Madison Graff  Date of death: 08-30-2024   Name of funeral home: [facility]  Phone number of funeral home: [phone]  Provider that needs to sign form: Lynwood Crandall  Timeline for signing: ASAP  Dr.Cable did agree to sign but did not complete the signature.

## 2024-08-26 NOTE — Telephone Encounter (Signed)
 Called and spoke with Jan notifying her that the death certificate was completed

## 2024-09-20 DEATH — deceased

## 2024-09-30 ENCOUNTER — Ambulatory Visit: Admitting: Nurse Practitioner

## 2024-10-01 ENCOUNTER — Ambulatory Visit (HOSPITAL_BASED_OUTPATIENT_CLINIC_OR_DEPARTMENT_OTHER): Admitting: Internal Medicine

## 2024-11-06 ENCOUNTER — Other Ambulatory Visit: Payer: Self-pay | Admitting: Nurse Practitioner

## 2024-11-06 DIAGNOSIS — G4709 Other insomnia: Secondary | ICD-10-CM
